# Patient Record
Sex: Female | Born: 1940
Health system: Southern US, Community
[De-identification: ages and names within clinical notes are randomized; demographics above are authoritative.]

## PROBLEM LIST (undated history)

## (undated) DIAGNOSIS — C50919 Malignant neoplasm of unspecified site of unspecified female breast: Secondary | ICD-10-CM

## (undated) DIAGNOSIS — K5790 Diverticulosis of intestine, part unspecified, without perforation or abscess without bleeding: Secondary | ICD-10-CM

## (undated) DIAGNOSIS — K635 Polyp of colon: Secondary | ICD-10-CM

## (undated) DIAGNOSIS — M1990 Unspecified osteoarthritis, unspecified site: Secondary | ICD-10-CM

## (undated) DIAGNOSIS — M810 Age-related osteoporosis without current pathological fracture: Secondary | ICD-10-CM

## (undated) DIAGNOSIS — K859 Acute pancreatitis without necrosis or infection, unspecified: Secondary | ICD-10-CM

## (undated) HISTORY — DX: Age-related osteoporosis without current pathological fracture: M81.0

## (undated) HISTORY — PX: IR SCLEROTHERAPY OF A FLUID COLLECTION: IMG6090

## (undated) HISTORY — DX: Polyp of colon: K63.5

## (undated) HISTORY — PX: BREAST BIOPSY: SHX20

## (undated) HISTORY — DX: Malignant neoplasm of unspecified site of unspecified female breast: C50.919

## (undated) HISTORY — DX: Diverticulosis of intestine, part unspecified, without perforation or abscess without bleeding: K57.90

## (undated) HISTORY — PX: OTHER SURGICAL HISTORY: SHX169

## (undated) HISTORY — DX: Acute pancreatitis without necrosis or infection, unspecified: K85.90

## (undated) HISTORY — PX: MASTECTOMY: SHX3

---

## 1968-08-04 HISTORY — PX: VARICOSE VEIN SURGERY: SHX832

## 1996-08-04 HISTORY — PX: OTHER SURGICAL HISTORY: SHX169

## 2002-08-04 DIAGNOSIS — C50919 Malignant neoplasm of unspecified site of unspecified female breast: Secondary | ICD-10-CM

## 2002-08-04 HISTORY — PX: MODIFIED RADICAL MASTECTOMY: SHX5703

## 2002-08-04 HISTORY — DX: Malignant neoplasm of unspecified site of unspecified female breast: C50.919

## 2003-08-05 HISTORY — PX: CYST EXCISION: SHX5701

## 2016-08-05 DIAGNOSIS — Z0000 Encounter for general adult medical examination without abnormal findings: Secondary | ICD-10-CM | POA: Diagnosis not present

## 2016-08-05 DIAGNOSIS — Z6820 Body mass index (BMI) 20.0-20.9, adult: Secondary | ICD-10-CM | POA: Diagnosis not present

## 2016-08-14 DIAGNOSIS — Z01411 Encounter for gynecological examination (general) (routine) with abnormal findings: Secondary | ICD-10-CM | POA: Diagnosis not present

## 2016-08-14 DIAGNOSIS — Z1239 Encounter for other screening for malignant neoplasm of breast: Secondary | ICD-10-CM | POA: Diagnosis not present

## 2016-08-14 DIAGNOSIS — Z1211 Encounter for screening for malignant neoplasm of colon: Secondary | ICD-10-CM | POA: Diagnosis not present

## 2016-08-14 DIAGNOSIS — Z1231 Encounter for screening mammogram for malignant neoplasm of breast: Secondary | ICD-10-CM | POA: Diagnosis not present

## 2016-08-14 DIAGNOSIS — E559 Vitamin D deficiency, unspecified: Secondary | ICD-10-CM | POA: Diagnosis not present

## 2016-08-14 DIAGNOSIS — N951 Menopausal and female climacteric states: Secondary | ICD-10-CM | POA: Diagnosis not present

## 2016-08-14 DIAGNOSIS — M810 Age-related osteoporosis without current pathological fracture: Secondary | ICD-10-CM | POA: Diagnosis not present

## 2016-08-14 DIAGNOSIS — Z124 Encounter for screening for malignant neoplasm of cervix: Secondary | ICD-10-CM | POA: Diagnosis not present

## 2016-08-14 DIAGNOSIS — L900 Lichen sclerosus et atrophicus: Secondary | ICD-10-CM | POA: Diagnosis not present

## 2016-08-14 DIAGNOSIS — C50911 Malignant neoplasm of unspecified site of right female breast: Secondary | ICD-10-CM | POA: Diagnosis not present

## 2016-08-14 DIAGNOSIS — L659 Nonscarring hair loss, unspecified: Secondary | ICD-10-CM | POA: Diagnosis not present

## 2016-08-14 DIAGNOSIS — Z800 Family history of malignant neoplasm of digestive organs: Secondary | ICD-10-CM | POA: Diagnosis not present

## 2016-09-03 DIAGNOSIS — Z853 Personal history of malignant neoplasm of breast: Secondary | ICD-10-CM | POA: Diagnosis not present

## 2016-09-25 DIAGNOSIS — N951 Menopausal and female climacteric states: Secondary | ICD-10-CM | POA: Diagnosis not present

## 2016-09-25 DIAGNOSIS — E559 Vitamin D deficiency, unspecified: Secondary | ICD-10-CM | POA: Diagnosis not present

## 2016-09-25 DIAGNOSIS — M810 Age-related osteoporosis without current pathological fracture: Secondary | ICD-10-CM | POA: Diagnosis not present

## 2016-10-14 DIAGNOSIS — F419 Anxiety disorder, unspecified: Secondary | ICD-10-CM | POA: Diagnosis not present

## 2016-10-14 DIAGNOSIS — R634 Abnormal weight loss: Secondary | ICD-10-CM | POA: Diagnosis not present

## 2016-10-14 DIAGNOSIS — G4700 Insomnia, unspecified: Secondary | ICD-10-CM | POA: Diagnosis not present

## 2016-10-14 DIAGNOSIS — M810 Age-related osteoporosis without current pathological fracture: Secondary | ICD-10-CM | POA: Diagnosis not present

## 2016-10-16 DIAGNOSIS — H2513 Age-related nuclear cataract, bilateral: Secondary | ICD-10-CM | POA: Diagnosis not present

## 2016-11-11 DIAGNOSIS — H25813 Combined forms of age-related cataract, bilateral: Secondary | ICD-10-CM | POA: Diagnosis not present

## 2016-11-11 DIAGNOSIS — H02834 Dermatochalasis of left upper eyelid: Secondary | ICD-10-CM | POA: Diagnosis not present

## 2016-11-11 DIAGNOSIS — H02831 Dermatochalasis of right upper eyelid: Secondary | ICD-10-CM | POA: Diagnosis not present

## 2016-11-20 DIAGNOSIS — H25813 Combined forms of age-related cataract, bilateral: Secondary | ICD-10-CM | POA: Diagnosis not present

## 2016-11-25 DIAGNOSIS — I10 Essential (primary) hypertension: Secondary | ICD-10-CM | POA: Diagnosis not present

## 2016-11-25 DIAGNOSIS — H269 Unspecified cataract: Secondary | ICD-10-CM | POA: Diagnosis not present

## 2016-11-25 DIAGNOSIS — Z6821 Body mass index (BMI) 21.0-21.9, adult: Secondary | ICD-10-CM | POA: Diagnosis not present

## 2016-11-25 DIAGNOSIS — M810 Age-related osteoporosis without current pathological fracture: Secondary | ICD-10-CM | POA: Diagnosis not present

## 2016-12-02 HISTORY — PX: CATARACT EXTRACTION: SUR2

## 2016-12-08 DIAGNOSIS — H25812 Combined forms of age-related cataract, left eye: Secondary | ICD-10-CM | POA: Diagnosis not present

## 2016-12-08 DIAGNOSIS — H2512 Age-related nuclear cataract, left eye: Secondary | ICD-10-CM | POA: Diagnosis not present

## 2016-12-09 DIAGNOSIS — Z961 Presence of intraocular lens: Secondary | ICD-10-CM | POA: Diagnosis not present

## 2016-12-09 DIAGNOSIS — Z9842 Cataract extraction status, left eye: Secondary | ICD-10-CM | POA: Diagnosis not present

## 2016-12-17 DIAGNOSIS — H25811 Combined forms of age-related cataract, right eye: Secondary | ICD-10-CM | POA: Diagnosis not present

## 2016-12-22 DIAGNOSIS — H25811 Combined forms of age-related cataract, right eye: Secondary | ICD-10-CM | POA: Diagnosis not present

## 2017-01-22 ENCOUNTER — Ambulatory Visit: Payer: Self-pay | Admitting: Family Medicine

## 2017-01-23 ENCOUNTER — Ambulatory Visit: Payer: Self-pay | Admitting: Family Medicine

## 2017-04-09 DIAGNOSIS — Z23 Encounter for immunization: Secondary | ICD-10-CM | POA: Diagnosis not present

## 2017-04-09 DIAGNOSIS — Z681 Body mass index (BMI) 19 or less, adult: Secondary | ICD-10-CM | POA: Diagnosis not present

## 2017-04-09 DIAGNOSIS — C50919 Malignant neoplasm of unspecified site of unspecified female breast: Secondary | ICD-10-CM | POA: Diagnosis not present

## 2017-04-09 DIAGNOSIS — I8390 Asymptomatic varicose veins of unspecified lower extremity: Secondary | ICD-10-CM | POA: Diagnosis not present

## 2017-04-09 DIAGNOSIS — D126 Benign neoplasm of colon, unspecified: Secondary | ICD-10-CM | POA: Diagnosis not present

## 2017-04-09 DIAGNOSIS — M810 Age-related osteoporosis without current pathological fracture: Secondary | ICD-10-CM | POA: Diagnosis not present

## 2017-04-09 DIAGNOSIS — Z1389 Encounter for screening for other disorder: Secondary | ICD-10-CM | POA: Diagnosis not present

## 2017-04-10 DIAGNOSIS — Z1389 Encounter for screening for other disorder: Secondary | ICD-10-CM | POA: Diagnosis not present

## 2017-04-10 DIAGNOSIS — C50919 Malignant neoplasm of unspecified site of unspecified female breast: Secondary | ICD-10-CM | POA: Diagnosis not present

## 2017-04-10 DIAGNOSIS — I8390 Asymptomatic varicose veins of unspecified lower extremity: Secondary | ICD-10-CM | POA: Diagnosis not present

## 2017-04-10 DIAGNOSIS — M810 Age-related osteoporosis without current pathological fracture: Secondary | ICD-10-CM | POA: Diagnosis not present

## 2017-04-10 DIAGNOSIS — Z23 Encounter for immunization: Secondary | ICD-10-CM | POA: Diagnosis not present

## 2017-04-10 DIAGNOSIS — Z681 Body mass index (BMI) 19 or less, adult: Secondary | ICD-10-CM | POA: Diagnosis not present

## 2017-04-10 DIAGNOSIS — D126 Benign neoplasm of colon, unspecified: Secondary | ICD-10-CM | POA: Diagnosis not present

## 2017-05-06 ENCOUNTER — Encounter: Payer: Self-pay | Admitting: Internal Medicine

## 2017-06-30 ENCOUNTER — Ambulatory Visit (INDEPENDENT_AMBULATORY_CARE_PROVIDER_SITE_OTHER): Payer: Medicare Other | Admitting: Internal Medicine

## 2017-06-30 ENCOUNTER — Encounter: Payer: Self-pay | Admitting: Internal Medicine

## 2017-06-30 ENCOUNTER — Encounter (INDEPENDENT_AMBULATORY_CARE_PROVIDER_SITE_OTHER): Payer: Self-pay

## 2017-06-30 VITALS — BP 150/70 | HR 96 | Ht 66.0 in | Wt 133.1 lb

## 2017-06-30 DIAGNOSIS — Z86010 Personal history of colonic polyps: Secondary | ICD-10-CM

## 2017-06-30 NOTE — Progress Notes (Addendum)
HISTORY OF PRESENT ILLNESS:  Lindsay Leach is a 76 y.o. female, retired LPN who sent today by her primary care provider Dr. Forde Dandy with a chief complaint of needing colon polyp surveillance colonoscopy. She relocated to [place] from [place] over the past year. She is accompanied today by her husband. She tells me that she has undergone at least 3 previous colonoscopies. No available information for review. She tells me that the last examination was performed July 2015. No family history of colon cancer in first-degree relatives. Maternal uncle at age 1. She does tell me that she has had diverticulosis. She is not certain what type of colon polyps she has had or how many. Her GI review of systems is entirely negative.  REVIEW OF SYSTEMS:  All non-GI ROS negative unless otherwise stated in the history of present illness .  Past Medical History:  Diagnosis Date  . Breast cancer ([place]) 2004   right  . Colon polyps   . Diverticulosis   . Pancreatitis     Past Surgical History:  Procedure Laterality Date  . CYST EXCISION  2005   Val[facility]cular  . IR SCLEROTHERAPY OF A FLUID COLLECTION  200, 2006  . MODIFIED RADICAL MASTECTOMY Right 2004  . Phlebectomies  1998  . VARICOSE VEIN SURGERY Bilateral 1970    Social History Lindsay Leach  reports that  has never smoked. she has never used smokeless tobacco. She reports that she does not drink alcohol or use drugs.  family history includes Colon cancer in her maternal uncle; Diabetes in her maternal grandfather; Lung cancer in her father.  Allergies  Allergen Reactions  . Advil [Ibuprofen] Rash  . Sulfa Antibiotics Nausea Only       PHYSICAL EXAMINATION: Vital signs: BP (!) 150/70 (BP Location: Left Arm, Patient Position: Sitting, Cuff Size: Normal)   Pulse 96   Ht 5\' 6"  (1.676 m) Comment: height measured without shoes  Wt 133 lb 2 oz (60.4 kg)   BMI 21.49 kg/m   Constitutional: generally well-appearing, no acute distress Psychiatric:  alert and oriented x3, cooperative Eyes: extraocular movements intact, anicteric, conjunctiva pink Mouth: oral pharynx moist, no lesions Neck: supple no lymphadenopathy Cardiovascular: heart regular rate and rhythm, no murmur Lungs: clear to auscultation bilaterally Abdomen: soft, nontender, nondistended, no obvious ascites, no peritoneal signs, normal bowel sounds, no organomegaly Rectal: Omitted Extremities: no clubbing, cyanosis, or lower extremity edema bilaterally Skin: no lesions on visible extremities Neuro: No focal deficits. Cranial nerves intact  ASSESSMENT:  #50. 76 year old with history of colon polyps. Last colonoscopy 3 years ago. No available information for review. I explained to her current colon cancer surveillance guidelines. We discussed these with and without respect age. At this point I feel it is important to obtain her prior reports and pathology to stratify risk and make the most appropriate recommendation. She understands, agrees, and is appreciative   PLAN:  #1. My medical assistant will obtain relevant outside records from [place]. Thereafter I will review these records and my assistant will get back to the patient with a recommendation. I have asked the patient to contact the office if she has not heard from [place] within a reasonable amount of time   A copy of this consultation note has been sent to Dr. Forde Dandy  ADDENDUM 08/14/2017 Pathology report from [place] [facility] received. Date of pathology 02/13/2014. Found to have subcentimeter tubular adenoma and hyperplastic polyp. No colonoscopy report. Based on this information routine follow-up would be recommended in 5 years  assuming that colonoscopy was complete with adequate preparation. I will have my nurse sure this information with the patient and see if she might obtain the colonoscopy report just to be certain.

## 2017-06-30 NOTE — Patient Instructions (Signed)
We will call you after Dr. Henrene Pastor has reviewed your records

## 2017-07-23 ENCOUNTER — Telehealth: Payer: Self-pay | Admitting: Internal Medicine

## 2017-07-24 NOTE — Telephone Encounter (Signed)
Spoke with patient and told her that I had left a message with the person who sent the records that they had send the pathology but not the actual colonoscopy.  I have not heard back so I will follow up today on getting the colonoscopy report to go with the pathology.  Patient understood.

## 2017-08-17 ENCOUNTER — Encounter: Payer: Self-pay | Admitting: Internal Medicine

## 2017-08-17 NOTE — Telephone Encounter (Signed)
Patient is wanting to know if we have received all previous GI records. Best # [phone]

## 2017-08-20 ENCOUNTER — Telehealth: Payer: Self-pay

## 2017-08-20 NOTE — Telephone Encounter (Signed)
Left message for pt to call back.  Spoke with pt and she is aware. Pt will try to get colon report. She was scheduled for a previsit and colon in February so these appts have been cancelled. Recall entered in epic for 02/2019.

## 2017-08-20 NOTE — Telephone Encounter (Signed)
-----   Message from Irene Shipper, MD sent at 08/14/2017 11:52 AM EST ----- Regarding: Outside pathology report received Please contact the patient and let her know that I received her outside pathology report. There was no accompanying colonoscopy report. If she can get a colonoscopy report that would be helpful. If not, based on the available information and assuming that her colonoscopy was complete and adequate preparation, she would be due 5 years from her last examination or July 2020. She will be 78 at that time. We are happy to put in a recall if she is interested in surveillance at that time and is medically fit. Please convert this to a phone note so it may be referred to the future. Thank you Dr. Henrene Pastor

## 2017-09-30 ENCOUNTER — Encounter: Payer: Medicare Other | Admitting: Internal Medicine

## 2017-10-07 DIAGNOSIS — R82998 Other abnormal findings in urine: Secondary | ICD-10-CM | POA: Diagnosis not present

## 2019-01-31 ENCOUNTER — Encounter: Payer: Self-pay | Admitting: Internal Medicine

## 2019-04-15 ENCOUNTER — Emergency Department (HOSPITAL_COMMUNITY)
Admission: EM | Admit: 2019-04-15 | Discharge: 2019-04-15 | Disposition: A | Discharge status: Home / Self Care | Payer: Medicare Other | Attending: Emergency Medicine | Admitting: Emergency Medicine

## 2019-04-15 ENCOUNTER — Emergency Department (HOSPITAL_COMMUNITY): Payer: Medicare Other

## 2019-04-15 ENCOUNTER — Other Ambulatory Visit: Payer: Self-pay

## 2019-04-15 DIAGNOSIS — R000 Tachycardia, unspecified: Secondary | ICD-10-CM

## 2019-04-15 DIAGNOSIS — R002 Palpitations: Secondary | ICD-10-CM | POA: Diagnosis not present

## 2019-04-15 DIAGNOSIS — Z79899 Other long term (current) drug therapy: Secondary | ICD-10-CM | POA: Insufficient documentation

## 2019-04-15 DIAGNOSIS — Z20828 Contact with and (suspected) exposure to other viral communicable diseases: Secondary | ICD-10-CM | POA: Diagnosis not present

## 2019-04-15 LAB — URINALYSIS, ROUTINE W REFLEX MICROSCOPIC
Bacteria, UA: NONE SEEN
Bilirubin Urine: NEGATIVE
Glucose, UA: NEGATIVE mg/dL
Ketones, ur: NEGATIVE mg/dL
Leukocytes,Ua: NEGATIVE
Nitrite: NEGATIVE
Protein, ur: NEGATIVE mg/dL
Specific Gravity, Urine: 1.005 (ref 1.005–1.030)
pH: 7 (ref 5.0–8.0)

## 2019-04-15 LAB — CBC WITH DIFFERENTIAL/PLATELET
Abs Immature Granulocytes: 0.02 10*3/uL (ref 0.00–0.07)
Basophils Absolute: 0 10*3/uL (ref 0.0–0.1)
Basophils Relative: 0 %
Eosinophils Absolute: 0 10*3/uL (ref 0.0–0.5)
Eosinophils Relative: 0 %
HCT: 42.1 % (ref 36.0–46.0)
Hemoglobin: 13.7 g/dL (ref 12.0–15.0)
Immature Granulocytes: 0 %
Lymphocytes Relative: 19 %
Lymphs Abs: 1.2 10*3/uL (ref 0.7–4.0)
MCH: 31 pg (ref 26.0–34.0)
MCHC: 32.5 g/dL (ref 30.0–36.0)
MCV: 95.2 fL (ref 80.0–100.0)
Monocytes Absolute: 0.4 10*3/uL (ref 0.1–1.0)
Monocytes Relative: 7 %
Neutro Abs: 4.7 10*3/uL (ref 1.7–7.7)
Neutrophils Relative %: 74 %
Platelets: 218 10*3/uL (ref 150–400)
RBC: 4.42 MIL/uL (ref 3.87–5.11)
RDW: 12.6 % (ref 11.5–15.5)
WBC: 6.4 10*3/uL (ref 4.0–10.5)
nRBC: 0 % (ref 0.0–0.2)

## 2019-04-15 LAB — COMPREHENSIVE METABOLIC PANEL
ALT: 20 U/L (ref 0–44)
AST: 22 U/L (ref 15–41)
Albumin: 4.4 g/dL (ref 3.5–5.0)
Alkaline Phosphatase: 78 U/L (ref 38–126)
Anion gap: 11 (ref 5–15)
BUN: 9 mg/dL (ref 8–23)
CO2: 26 mmol/L (ref 22–32)
Calcium: 9.6 mg/dL (ref 8.9–10.3)
Chloride: 103 mmol/L (ref 98–111)
Creatinine, Ser: 0.65 mg/dL (ref 0.44–1.00)
GFR calc Af Amer: >60 mL/min (ref >60)
GFR calc non Af Amer: >60 mL/min (ref >60)
Glucose, Bld: 131 mg/dL — ABNORMAL HIGH (ref 70–99)
Potassium: 3.8 mmol/L (ref 3.5–5.1)
Sodium: 140 mmol/L (ref 135–145)
Total Bilirubin: 0.6 mg/dL (ref 0.3–1.2)
Total Protein: 7.7 g/dL (ref 6.5–8.1)

## 2019-04-15 LAB — LACTIC ACID, PLASMA: Lactic Acid, Venous: 1.7 mmol/L (ref 0.5–1.9)

## 2019-04-15 LAB — SARS CORONAVIRUS 2 BY RT PCR (HOSPITAL ORDER, PERFORMED IN ~~LOC~~ HOSPITAL LAB): SARS Coronavirus 2: NEGATIVE

## 2019-04-15 LAB — MAGNESIUM: Magnesium: 2.1 mg/dL (ref 1.7–2.4)

## 2019-04-15 MED ORDER — ASPIRIN EC 325 MG PO TBEC: 325.0000 mg | DELAYED_RELEASE_TABLET | Freq: Every day | ORAL | 0 refills | Status: DC

## 2019-04-15 MED ORDER — SODIUM CHLORIDE 0.9 % IV BOLUS
1000.0000 mL | Freq: Once | INTRAVENOUS | Status: AC
  Administered 2019-04-15: 1000 mL via INTRAVENOUS

## 2019-04-15 NOTE — ED Notes (Signed)
Patient verbalized understanding of discharge instructions and denies any further needs or questions at this time. VS stable. Patient ambulatory with steady gait. Assisted to ED entrance where her daughter will pick her up.

## 2019-04-15 NOTE — ED Triage Notes (Signed)
Patient in via [place] from [facility] for tachycardia - ECG with EMS showed SVT with HR 160-180 - decreased to 100 after receiving 5mg  metoprolol IV en route. Patient states she went to her doctor because she's felt lightheaded with position changes over the last 4 days. Also endorses fever 100.20F at home last night and this morning, though she denies any other symptoms. No chest pain, shortness of breath, cough. A&O x 4, denies dizziness at this time. Resp e/u, skin w/d.

## 2019-04-15 NOTE — ED Provider Notes (Signed)
Blood pressure (!) 142/89, pulse (!) 112, temperature 98.6 F (37 C), temperature source Oral, resp. rate 17, SpO2 99 %.  Assuming care from Dr. Regenia Skeeter.  In short, Lindsay Leach is a 78 y.o. female with a chief complaint of Tachycardia .  Refer to the original H&P for additional details.  The current plan of care is to f/u on COVID testing and UA.  03:50 PM  COVID testing and UA are unremarkable.  Patient is eating and drinking without difficulty.  She is ambulatory here in the department with no symptoms.  She is in a sinus rhythm without tachycardia.  My review of the EMS strip shows SVT with occasional irregularity.  My suspicion that this represents atrial fibrillation is very low.  Given the somewhat irregular appearance at times I will start a full dose aspirin, at the patient has no contraindications to this, and will refer her to cardiology.  Ambulatory referral was placed.  Discussed ED return precautions.   EKG Interpretation  Date/Time:  Friday April 15 2019 13:43:13 EDT Ventricular Rate:  120 PR Interval:    QRS Duration: 82 QT Interval:  302 QTC Calculation: 427 R Axis:   81 Text Interpretation:  Sinus tachycardia Borderline right axis deviation Left ventricular hypertrophy No old tracing to compare Confirmed by Sherwood Gambler [phone]) on 04/15/2019 1:49:40 PM          Lindsay Leach, Lindsay Olds, MD 04/15/19 1556

## 2019-04-15 NOTE — ED Notes (Signed)
Patient ambulatory to restroom and back. Denies lightheadedness with position changes.

## 2019-04-15 NOTE — ED Notes (Signed)
MD states okay for patient to eat/drink - provided patient with [place] sandwich and ice water.

## 2019-04-15 NOTE — Discharge Instructions (Signed)
You were seen in the emergency department today with high heart rate.  I would like for you to start a full dose aspirin regimen and follow-up with the cardiologist.  I have placed a referral and they should be calling you to schedule an appointment.  If you have not heard from them by late Monday/early Tuesday I would like for you to call to confirm your appointment.  Return to the emergency department with any new or suddenly worsening symptoms.

## 2019-04-15 NOTE — ED Provider Notes (Signed)
[facility] EMERGENCY DEPARTMENT Provider Note   CSN: [id number] Arrival date & time: 04/15/19  1330     History   Chief Complaint Chief Complaint  Patient presents with  . Tachycardia    HPI Lindsay Leach is a 78 y.o. female.     HPI  78 year old female presents with dizziness and tachycardia.  The patient has felt lightheaded like she might pass out for about 4 days.  Comes and goes.  No palpitations though occasionally she has heard her heart beating in her head.  There is no headache, chest pain, shortness of breath, cough, vomiting, diarrhea or abdominal pain.  No urinary symptoms.  Yesterday and today had temperatures of 100.4.  Her blood pressure was also quite elevated at [id number] systolic but she states she does not have hypertension.  EMS noted her to be very tachycardic and what they described as possible SVT though with fluctuating heart rates up to 160.  Gave 5 mg IV metoprolol which seemed to help.  Patient currently feels okay. No specific known COVID contacts.  Past Medical History:  Diagnosis Date  . Breast cancer ([place]) 2004   right  . Colon polyps   . Diverticulosis   . Pancreatitis     There are no active problems to display for this patient.   Past Surgical History:  Procedure Laterality Date  . CYST EXCISION  2005   Vallecular  . IR SCLEROTHERAPY OF A FLUID COLLECTION  200, 2006  . MODIFIED RADICAL MASTECTOMY Right 2004  . Phlebectomies  1998  . VARICOSE VEIN SURGERY Bilateral 1970     OB History   No obstetric history on file.      Home Medications    Prior to Admission medications   Medication Sig Start Date End Date Taking? Authorizing Provider  BIOTIN 5000 PO Take 1 capsule by mouth daily.   Yes [provider]  cholecalciferol (VITAMIN D) 1000 units tablet Take 1,000 Units by mouth 2 (two) times daily.   Yes [provider]  ibandronate (BONIVA) 150 MG tablet Take 150 mg by mouth every 30 (thirty) days.  03/15/19  Yes [provider]  magnesium gluconate (MAGONATE) 500 MG tablet Take 500 mg by mouth daily.   Yes [provider]  Multiple Vitamins-Minerals (CENTRUM SILVER PO) Take 1 tablet by mouth daily.   Yes [provider]    Family History Family History  Problem Relation Age of Onset  . Lung cancer Father   . Diabetes Maternal Grandfather   . Colon cancer Maternal Uncle     Social History Social History   Tobacco Use  . Smoking status: Never Smoker  . Smokeless tobacco: Never Used  Substance Use Topics  . Alcohol use: No    Frequency: Never  . Drug use: No     Allergies   Advil [ibuprofen] and Sulfa antibiotics   Review of Systems Review of Systems  Constitutional: Positive for fever.  Respiratory: Negative for cough and shortness of breath.   Cardiovascular: Negative for chest pain.  Gastrointestinal: Negative for abdominal pain and vomiting.  Genitourinary: Negative for dysuria.  Skin: Negative for rash.  Neurological: Positive for light-headedness.  All other systems reviewed and are negative.    Physical Exam Updated Vital Signs BP (!) 157/93   Pulse 96   Temp 98.6 F (37 C) (Oral)   Resp 16   SpO2 99%   Physical Exam Vitals signs and nursing note reviewed.  Constitutional:  Appearance: She is well-developed.  HENT:     Head: Normocephalic and atraumatic.     Right Ear: External ear normal.     Left Ear: External ear normal.     Nose: Nose normal.  Eyes:     General:        Right eye: No discharge.        Left eye: No discharge.  Cardiovascular:     Rate and Rhythm: Regular rhythm. Tachycardia present.     Heart sounds: Normal heart sounds.  Pulmonary:     Effort: Pulmonary effort is normal.     Breath sounds: Normal breath sounds.  Chest:     Comments: Previous right mastectomy Abdominal:     General: There is no distension.     Palpations: Abdomen is soft.     Tenderness: There is no abdominal  tenderness.  Skin:    General: Skin is warm and dry.     Findings: No erythema.  Neurological:     Mental Status: She is alert.  Psychiatric:        Mood and Affect: Mood is not anxious.      ED Treatments / Results  Labs (all labs ordered are listed, but only abnormal results are displayed) Labs Reviewed  COMPREHENSIVE METABOLIC PANEL - Abnormal; Notable for the following components:      Result Value   Glucose, Bld 131 (*)    All other components within normal limits  URINALYSIS, ROUTINE W REFLEX MICROSCOPIC - Abnormal; Notable for the following components:   Color, Urine STRAW (*)    Hgb urine dipstick SMALL (*)    All other components within normal limits  SARS CORONAVIRUS 2 (HOSPITAL ORDER, [place] [facility])  CULTURE, BLOOD (ROUTINE X 2)  CULTURE, BLOOD (ROUTINE X 2)  URINE CULTURE  LACTIC ACID, PLASMA  CBC WITH DIFFERENTIAL/PLATELET  MAGNESIUM  LACTIC ACID, PLASMA    EKG EKG Interpretation  Date/Time:  Friday April 15 2019 13:43:13 EDT Ventricular Rate:  120 PR Interval:    QRS Duration: 82 QT Interval:  302 QTC Calculation: 427 R Axis:   81 Text Interpretation:  Sinus tachycardia Borderline right axis deviation Left ventricular hypertrophy No old tracing to compare Confirmed by Sherwood Gambler [phone]) on 04/15/2019 1:49:40 PM   Radiology No results found.  Procedures Procedures (including critical care time)  Medications Ordered in ED Medications  sodium chloride 0.9 % bolus 1,000 mL (0 mLs Intravenous Stopped 04/15/19 1511)     Initial Impression / Assessment and Plan / ED Course  I have reviewed the triage vital signs and the nursing notes.  Pertinent labs & imaging results that were available during my care of the patient were reviewed by me and considered in my medical decision making (see chart for details).        Patient is asymptomatic here.  She reports fever at home though is afebrile here.  Will send  coronavirus testing.  Otherwise screening lab work so far is okay and she has been given IV fluids.  Sinus tachycardia on original presentation though this is improving with fluids.  Her monitoring strip from EMS could be rapid A. fib or could be SVT though it is not clear on the 3-lead given.  Further labs and treatment pending, if everything came back okay I think would be reasonable to discharge her home assuming she does not go back into an arrhythmia and will need cards follow-up.  Care to Dr. Laverta Baltimore  Final Clinical Impressions(s) / ED Diagnoses   Final diagnoses:  None    ED Discharge Orders    None       Sherwood Gambler, MD 04/15/19 [phone]

## 2019-04-16 LAB — URINE CULTURE: Culture: <10000 — AB

## 2019-04-18 DIAGNOSIS — Z720 Tobacco use: Secondary | ICD-10-CM | POA: Insufficient documentation

## 2019-04-18 DIAGNOSIS — R002 Palpitations: Secondary | ICD-10-CM | POA: Insufficient documentation

## 2019-04-18 NOTE — Progress Notes (Signed)
Cardiology Office Note   Date:  04/19/2019   ID:  Lindsay Leach, DOB Dec 25, 1940, MRN [id number]  PCP:  Reynold Bowen, MD  Cardiologist:   No primary care provider on file.   No chief complaint on file.     History of Present Illness: Lindsay Leach is a 78 y.o. female who is referred by Reynold Bowen, MD for evaluation of palpitations and tachycardia.    She was in the ED for this a few days ago.  I reviewed these records for this visit.  She called EMS and was thought to have SVT.  she was treated with IV metoprolol.  In the ED she had a HR of 120.  The EKG appeared to be sinus tach.      She went to her PCP because of increased HR and lightheadedness the other day.  Her heart rate was elevated but it typically is in the 90s.  However, when she went to Dr. Forde Dandy her blood pressure was 186/106.  Her heart rate was 160s.  When she has the emergency room was elevated as prescribed.  Blood work was unremarkable.  She had some recent blood work sent her primary care office but I do not see a TSH.  This is all been unremarkable.  She wondered if she could have had a panic attack because she has been watching the news.  She otherwise feels pretty well.  She does her activities of daily living and household chores.  She does some balance exercises and walking.  She says she is always been slightly nervous but she otherwise feels well.  She is not having any chest discomfort, neck or arm discomfort.  She is not been having any shortness of breath, PND or orthopnea.  She is not describing any further lightheadedness, presyncope or syncope.  She does not report orthostatic symptoms.   Past Medical History:  Diagnosis Date  . Breast cancer ([place]) 2004   right  . Colon polyps   . Diverticulosis   . Osteoporosis   . Pancreatitis     Past Surgical History:  Procedure Laterality Date  . CYST EXCISION  2005   Vallecular  . IR SCLEROTHERAPY OF A FLUID COLLECTION  200, 2006  . MODIFIED RADICAL  MASTECTOMY Right 2004  . Phlebectomies  1998  . VARICOSE VEIN SURGERY Bilateral 1970     Current Outpatient Medications  Medication Sig Dispense Refill  . BIOTIN 5000 PO Take 1 capsule by mouth daily.    . cholecalciferol (VITAMIN D) 1000 units tablet Take 1,000 Units by mouth 2 (two) times daily.    Marland Kitchen ibandronate (BONIVA) 150 MG tablet Take 150 mg by mouth every 30 (thirty) days.    . magnesium gluconate (MAGONATE) 500 MG tablet Take 500 mg by mouth daily.    . Multiple Vitamins-Minerals (CENTRUM SILVER PO) Take 1 tablet by mouth daily.     No current facility-administered medications for this visit.     Allergies:   Advil [ibuprofen] and Sulfa antibiotics    Social History:  The patient  reports that she has never smoked. She has never used smokeless tobacco. She reports that she does not drink alcohol or use drugs.   Family History:  The patient's family history includes Colon cancer in her maternal uncle; Diabetes in her maternal grandfather; Lung cancer in her father.    ROS:  Please see the history of present illness.   Otherwise, review of systems are positive for none.  All other systems are reviewed and negative.    PHYSICAL EXAM: VS:  BP (!) 179/89   Pulse (!) 123   Temp (!) 97.3 F (36.3 C)   Ht 5\' 6"  (1.676 m)   Wt 143 lb 12.8 oz (65.2 kg)   SpO2 97%   BMI 23.21 kg/m  , BMI Body mass index is 23.21 kg/m. GENERAL:  Well appearing HEENT:  Pupils equal round and reactive, fundi not visualized, oral mucosa unremarkable NECK:  No jugular venous distention, waveform within normal limits, carotid upstroke brisk and symmetric, no bruits, no thyromegaly LYMPHATICS:  No cervical, inguinal adenopathy LUNGS:  Clear to auscultation bilaterally BACK:  No CVA tenderness CHEST:  Unremarkable HEART:  PMI not displaced or sustained,S1 and S2 within normal limits, no S3, no S4, no clicks, no rubs, no murmurs ABD:  Flat, positive bowel sounds normal in frequency in pitch, no  bruits, no rebound, no guarding, no midline pulsatile mass, no hepatomegaly, no splenomegaly EXT:  2 plus pulses throughout, no edema, no cyanosis no clubbing SKIN:  No rashes no nodules NEURO:  Cranial nerves II through XII grossly intact, motor grossly intact throughout PSYCH:  Cognitively intact, oriented to person place and time    EKG:  EKG is ordered today. The ekg ordered today demonstrates sinus tachycardia, rate 105, axis within normal limits, intervals within normal limits, no acute ST-T wave changes.   Recent Labs: 04/15/2019: ALT 20; BUN 9; Creatinine, Ser 0.65; Hemoglobin 13.7; Magnesium 2.1; Platelets 218; Potassium 3.8; Sodium 140    Lipid Panel No results found for: CHOL, TRIG, HDL, CHOLHDL, VLDL, LDLCALC, LDLDIRECT    Wt Readings from Last 3 Encounters:  04/19/19 143 lb 12.8 oz (65.2 kg)  06/30/17 133 lb 2 oz (60.4 kg)      Other studies Reviewed: Additional studies/ records that were reviewed today include: ED records. Review of the above records demonstrates:  Please see elsewhere in the note.     ASSESSMENT AND PLAN:  Tachycardia/palpitaitons:    This could be panic anxiety but I like to start with a TSH and a Holter monitor to make sure she has normal nighttime variation.  Other considerations would be pheochromocytoma although she is not having symptoms consistent with this.  I will keep this in mind for further work-up.  HTN: I reviewed her blood pressure diary and instructed her on getting more data points.  Her blood pressures been creeping down typically in the 140s over 90s now.  She might benefit from a beta-blocker pending the work-up above.   Current medicines are reviewed at length with the patient today.  The patient does not have concerns regarding medicines.  The following changes have been made:  no change  Labs/ tests ordered today include:   Orders Placed This Encounter  Procedures  . TSH  . LONG TERM MONITOR (3-14 DAYS)  . EKG  12-Lead     Disposition:   FU with me in one month after the Holter and blood work.   Signed, Minus Breeding, MD  04/19/2019 3:12 PM    [facility]

## 2019-04-19 ENCOUNTER — Other Ambulatory Visit: Payer: Self-pay

## 2019-04-19 ENCOUNTER — Ambulatory Visit (INDEPENDENT_AMBULATORY_CARE_PROVIDER_SITE_OTHER): Payer: Medicare Other | Admitting: Cardiology

## 2019-04-19 ENCOUNTER — Encounter: Payer: Self-pay | Admitting: Cardiology

## 2019-04-19 VITALS — BP 179/89 | HR 123 | Temp 97.3°F | Ht 66.0 in | Wt 143.8 lb

## 2019-04-19 DIAGNOSIS — R002 Palpitations: Secondary | ICD-10-CM

## 2019-04-19 DIAGNOSIS — I10 Essential (primary) hypertension: Secondary | ICD-10-CM | POA: Diagnosis not present

## 2019-04-19 NOTE — Patient Instructions (Addendum)
Medication Instructions:  STOP ASPIRIN   If you need a refill on your cardiac medications before your next appointment, please call your pharmacy.   Lab work: TSH TODAY   If you have labs (blood work) drawn today and your tests are completely normal, you will receive your results only by: Marland Kitchen MyChart Message (if you have MyChart) OR . A paper copy in the mail If you have any lab test that is abnormal or we need to change your treatment, we will call you to review the results.  Testing/Procedures: Your physician has recommended that you wear a holter monitor. Holter monitors are medical devices that record the heart's electrical activity. Doctors most often use these monitors to diagnose arrhythmias. Arrhythmias are problems with the speed or rhythm of the heartbeat. The monitor is a small, portable device. You can wear one while you do your normal daily activities. This is usually used to diagnose what is causing palpitations/syncope (passing out). 3 DAY MONITOR   Follow-Up: At [facility], you and your health needs are our priority.  As part of our continuing mission to provide you with exceptional heart care, we have created designated Provider Care Teams.  These Care Teams include your primary Cardiologist (physician) and Advanced Practice Providers (APPs -  Physician Assistants and Nurse Practitioners) who all work together to provide you with the care you need, when you need it. You will need a follow up appointment in 4 weeks.   You may see DR [facility] or one of the following Advanced Practice Providers on your designated Care Team:   Rosaria Ferries, PA-C . Jory Sims, DNP, ANP

## 2019-04-20 LAB — CULTURE, BLOOD (ROUTINE X 2)
Culture: NO GROWTH
Culture: NO GROWTH
Special Requests: ADEQUATE

## 2019-04-20 LAB — TSH: TSH: 0.914 u[IU]/mL (ref 0.450–4.500)

## 2019-04-21 ENCOUNTER — Telehealth: Payer: Self-pay | Admitting: *Deleted

## 2019-04-21 NOTE — Telephone Encounter (Signed)
3-14 day ZIO XT long term holter monitor to be mailed to the patients home.  Instructions reviewed briefly as they are included in the monitor kit. 

## 2019-04-29 ENCOUNTER — Ambulatory Visit (INDEPENDENT_AMBULATORY_CARE_PROVIDER_SITE_OTHER): Payer: Medicare Other

## 2019-04-29 DIAGNOSIS — R002 Palpitations: Secondary | ICD-10-CM | POA: Diagnosis not present

## 2019-05-16 DIAGNOSIS — I10 Essential (primary) hypertension: Secondary | ICD-10-CM | POA: Insufficient documentation

## 2019-05-16 DIAGNOSIS — R000 Tachycardia, unspecified: Secondary | ICD-10-CM | POA: Insufficient documentation

## 2019-05-16 NOTE — Progress Notes (Signed)
Cardiology Office Note   Date:  05/16/2019   ID:  Lindsay Leach, DOB 1941-04-01, MRN [id number]  PCP:  Reynold Bowen, MD  Cardiologist:   No primary care provider on file.   No chief complaint on file.     History of Present Illness: Lindsay Leach is a 78 y.o. female who is referred by Reynold Bowen, MD for evaluation of palpitations.    She was in the ED for this a few days ago.  I reviewed these records for this visit.  She called EMS and had a heart rate of 160.   I cannot find these strips.  The ED note mentions fibrillation vs SVT.  She was treated with metoprolol.       Since I last saw her she wore a monitor and I reviewed this with her today.  She had brief runs of nonsustained nonsustained supraventricular tachycardia.  There was no evidence of atrial fibrillation.  There was rare ventricular ectopy.  She did not have any real symptoms during this.  She is occasionally getting anxious.  She might feel her heart race a little bit but she did not record this on the monitor.  She tries to be active.  She is not having any chest pressure, neck or arm discomfort.  She is had no new shortness of breath, PND or orthopnea.  Has had no presyncope or syncope.  She is had no weight gain or edema.    Past Medical History:  Diagnosis Date  . Breast cancer ([place]) 2004   right  . Colon polyps   . Diverticulosis   . Osteoporosis   . Pancreatitis     Past Surgical History:  Procedure Laterality Date  . CYST EXCISION  2005   [place]  . IR SCLEROTHERAPY OF A FLUID COLLECTION  200, 2006  . MODIFIED RADICAL MASTECTOMY Right 2004  . Phlebectomies  1998  . VARICOSE VEIN SURGERY Bilateral 1970     Current Outpatient Medications  Medication Sig Dispense Refill  . BIOTIN 5000 PO Take 1 capsule by mouth daily.    . cholecalciferol (VITAMIN D) 1000 units tablet Take 1,000 Units by mouth 2 (two) times daily.    Marland Kitchen ibandronate (BONIVA) 150 MG tablet Take 150 mg by mouth every 30  (thirty) days.    . magnesium gluconate (MAGONATE) 500 MG tablet Take 500 mg by mouth daily.    . Multiple Vitamins-Minerals (CENTRUM SILVER PO) Take 1 tablet by mouth daily.     No current facility-administered medications for this visit.     Allergies:   Advil [ibuprofen] and Sulfa antibiotics    ROS:  Please see the history of present illness.   Otherwise, review of systems are positive for none.   All other systems are reviewed and negative.    PHYSICAL EXAM: VS:  There were no vitals taken for this visit. , BMI There is no height or weight on file to calculate BMI. GENERAL:  Well appearing NECK:  No jugular venous distention, waveform within normal limits, carotid upstroke brisk and symmetric, no bruits, no thyromegaly LUNGS:  Clear to auscultation bilaterally HEART:  PMI not displaced or sustained,S1 and S2 within normal limits, no S3, no S4, no clicks, no rubs, no murmurs ABD:  Flat, positive bowel sounds normal in frequency in pitch, no bruits, no rebound, no guarding, no midline pulsatile mass, no hepatomegaly, no splenomegaly EXT:  2 plus pulses throughout, no edema, no cyanosis no clubbing   EKG:  EKG is not ordered today.    Recent Labs: 04/15/2019: ALT 20; BUN 9; Creatinine, Ser 0.65; Hemoglobin 13.7; Magnesium 2.1; Platelets 218; Potassium 3.8; Sodium 140 04/19/2019: TSH 0.914    Lipid Panel No results found for: CHOL, TRIG, HDL, CHOLHDL, VLDL, LDLCALC, LDLDIRECT    Wt Readings from Last 3 Encounters:  04/19/19 143 lb 12.8 oz (65.2 kg)  06/30/17 133 lb 2 oz (60.4 kg)      Other studies Reviewed: Additional studies/ records that were reviewed today include: Holter  Review of the above records demonstrates:  Please see elsewhere in the note.     ASSESSMENT AND PLAN:  Tachycardia/palpitaitons:    She has brief runs of supraventricular tachycardia that likely are not related to the event that she had that took her to the emergency room.  That more than likely  it was sinus tachycardia related to some anxiety.  I cannot be sure that it was not supraventricular tachycardia of another kind however and we did discuss vagal maneuvers.  She can take as needed metoprolol 25 mg daily.  No other work-up is indicated at this point.   HTN: Blood pressures are well controlled.  No change in therapy.   Current medicines are reviewed at length with the patient today.  The patient does not have concerns regarding medicines.  The following changes have been made:  None    Labs/ tests ordered today include:  None  No orders of the defined types were placed in this encounter.    Disposition:   FU with me as needed.     Signed, Minus Breeding, MD  05/16/2019 9:04 PM    [place]

## 2019-05-17 ENCOUNTER — Other Ambulatory Visit: Payer: Self-pay

## 2019-05-17 ENCOUNTER — Encounter: Payer: Self-pay | Admitting: Cardiology

## 2019-05-17 ENCOUNTER — Ambulatory Visit: Payer: Medicare Other | Admitting: Cardiology

## 2019-05-17 VITALS — BP 152/79 | HR 98 | Temp 97.6°F | Ht 66.75 in | Wt 144.6 lb

## 2019-05-17 DIAGNOSIS — R000 Tachycardia, unspecified: Secondary | ICD-10-CM

## 2019-05-17 DIAGNOSIS — I10 Essential (primary) hypertension: Secondary | ICD-10-CM

## 2019-05-17 MED ORDER — METOPROLOL TARTRATE 25 MG PO TABS: 25.0000 mg | ORAL_TABLET | Freq: Two times a day (BID) | ORAL | 3 refills | Status: DC | PRN

## 2019-05-17 NOTE — Patient Instructions (Signed)
Medication Instructions:  Take Metoprolol 25mg  twice daily as needed.  If you need a refill on your cardiac medications before your next appointment, please call your pharmacy.   Lab work: NONE  Testing/Procedures: NONE  Follow-Up: As needed

## 2020-09-18 ENCOUNTER — Other Ambulatory Visit: Payer: Self-pay | Admitting: Obstetrics and Gynecology

## 2020-09-18 DIAGNOSIS — N6320 Unspecified lump in the left breast, unspecified quadrant: Secondary | ICD-10-CM

## 2020-10-01 ENCOUNTER — Other Ambulatory Visit: Payer: Self-pay | Admitting: Obstetrics and Gynecology

## 2020-10-01 ENCOUNTER — Ambulatory Visit
Admission: RE | Admit: 2020-10-01 | Discharge: 2020-10-01 | Disposition: A | Discharge status: Home / Self Care | Payer: Medicare Other | Source: Ambulatory Visit | Attending: Obstetrics and Gynecology | Admitting: Obstetrics and Gynecology

## 2020-10-01 ENCOUNTER — Other Ambulatory Visit: Payer: Self-pay

## 2020-10-01 DIAGNOSIS — N6320 Unspecified lump in the left breast, unspecified quadrant: Secondary | ICD-10-CM

## 2020-10-01 DIAGNOSIS — R921 Mammographic calcification found on diagnostic imaging of breast: Secondary | ICD-10-CM

## 2020-10-05 ENCOUNTER — Other Ambulatory Visit: Payer: Self-pay

## 2020-10-05 ENCOUNTER — Ambulatory Visit
Admission: RE | Admit: 2020-10-05 | Discharge: 2020-10-05 | Disposition: A | Discharge status: Home / Self Care | Payer: Medicare Other | Source: Ambulatory Visit | Attending: Obstetrics and Gynecology | Admitting: Obstetrics and Gynecology

## 2020-10-05 DIAGNOSIS — R921 Mammographic calcification found on diagnostic imaging of breast: Secondary | ICD-10-CM

## 2020-10-05 DIAGNOSIS — N6320 Unspecified lump in the left breast, unspecified quadrant: Secondary | ICD-10-CM

## 2020-10-08 ENCOUNTER — Other Ambulatory Visit: Payer: Self-pay | Admitting: Obstetrics and Gynecology

## 2020-10-08 DIAGNOSIS — R921 Mammographic calcification found on diagnostic imaging of breast: Secondary | ICD-10-CM

## 2020-10-09 ENCOUNTER — Ambulatory Visit
Admission: RE | Admit: 2020-10-09 | Discharge: 2020-10-09 | Disposition: A | Discharge status: Home / Self Care | Payer: Medicare Other | Source: Ambulatory Visit | Attending: Obstetrics and Gynecology | Admitting: Obstetrics and Gynecology

## 2020-10-09 ENCOUNTER — Other Ambulatory Visit: Payer: Self-pay

## 2020-10-09 DIAGNOSIS — R921 Mammographic calcification found on diagnostic imaging of breast: Secondary | ICD-10-CM

## 2020-10-16 ENCOUNTER — Ambulatory Visit: Payer: Self-pay | Admitting: Surgery

## 2020-10-16 DIAGNOSIS — C50912 Malignant neoplasm of unspecified site of left female breast: Secondary | ICD-10-CM

## 2020-10-16 NOTE — H&P (View-Only) (Signed)
Lindsay Leach Appointment: 10/16/2020 9:20 AM Location: [facility] Patient #: [id number] DOB: 03/19/1941 Married / Language: English / Race: White Female  History of Present Illness (Lindsay Leach A. Lindsay Alanis MD; 10/16/2020 9:41 AM) Patient words: Pleasant 79-year-old female with a history of right breast cancer in 2004 treated with mastectomy and chemotherapy in [place] presents with a new left breast mass. As evaluated by mammogram and ultrasound and core biopsy which showed a mass measuring 1.8 cm in the subareolar position with area of calcifications around it. The entire is about 2-1/2 cm. Core biopsy showed invasive lobular carcinoma grade 2 ER weakly positive PR negative HER-2/neu negative. She has a significant amount of bruising. No family history of breast cancer.                   79-year-old female with a palpable area of concern in the left breast. The patient does have a history of a remote excision in the left breast for atypical lobular hyperplasia.  EXAM: DIGITAL DIAGNOSTIC UNILATERAL LEFT MAMMOGRAM WITH TOMOSYNTHESIS AND CAD; ULTRASOUND LEFT BREAST LIMITED  TECHNIQUE: Left digital diagnostic mammography and breast tomosynthesis was performed. The images were evaluated with computer-aided detection.; Targeted ultrasound examination of the left breast was performed  COMPARISON: Previous exams.  ACR Breast Density Category b: There are scattered areas of fibroglandular density.  FINDINGS: Cc and MLO tomograms as well as spot compression magnification views were performed of the left breast. There is a mass with margin irregularity and associated calcifications in the retroareolar left breast, with the mass and calcifications all together measuring approximately 2 x 2.5 cm. This corresponds with the patient's palpable area of concern.  Physical examination reveals a firm masslike area in the immediate retroareolar left breast.  Targeted  ultrasound of the left breast was performed. There is an irregular hypoechoic mass containing calcifications in the left breast at 3 o'clock retroareolar measuring 1.2 x 1 x 1.3 cm. This corresponds well with the mass seen in the left breast at mammography.  No lymphadenopathy seen in the left axilla.  IMPRESSION: Suspicious palpable 1.3 cm mass in the left breast at 3 o'clock retroareolar with associated suspicious calcifications. The mass and calcifications all together measure 2.5 cm.  RECOMMENDATION: 1. Recommend ultrasound-guided biopsy of the mass in the left breast at 3 o'clock retroareolar with recommendation to perform this procedure first.  2. Recommend stereotactic guided biopsy of the calcifications furthest from the biopsied mass to determine extent of disease.  I have discussed the findings and recommendations with the patient. If applicable, a reminder letter will be sent to the patient regarding the next appointment.  BI-RADS CATEGORY 4: Suspicious.   Electronically Signed By: Lindsay Leach M.D. On: 10/01/2020 14:54 ADDITIONAL INFORMATION: FLUORESCENCE IN-SITU HYBRIDIZATION Results: GROUP 5: HER2 **NEGATIVE** Equivocal form of amplification of the HER2 gene was detected in the IHC 2+ tissue sample received from this individual. HER2 FISH was performed by a technologist and cell imaging and analysis on the Lindsay Leach. RATIO OF HER2/CEN17 SIGNALS 1.15 AVERAGE HER2 COPY NUMBER PER CELL 1.50 The ratio of HER2/CEN 17 is within the range < 2.0 of HER2/CEN 17 and a copy number of HER2 signals per cell is <4.0. Lindsay Leach 1:1,2018 Lindsay BUTLER MD Pathologist, Electronic Signature ( Signed 10/11/2020) PROGNOSTIC INDICATORS Results: IMMUNOHISTOCHEMICAL AND MORPHOMETRIC ANALYSIS PERFORMED MANUALLY The tumor cells are EQUIVOCAL for Her2 (2+). HER 2 by FISH will be performed and the results reported separately Estrogen Receptor: 10%, POSITIVE, WEAK  STAINING   INTENSITY Progesterone Receptor: 0%, NEGATIVE Proliferation Marker Ki67: 2% 1 of 3 Amended copy Addendum FINAL for Leach, Lindsay ([id number]) ADDITIONAL INFORMATION:(continued) COMMENT: The negative hormone receptor study(ies) in this case has an internal positive control. REFERENCE RANGE ESTROGEN RECEPTOR NEGATIVE 0% POSITIVE =>1% REFERENCE RANGE PROGESTERONE RECEPTOR NEGATIVE 0% POSITIVE =>1% All controls stained appropriately Lindsay BUTLER MD Pathologist, Electronic Signature ( Signed 10/11/2020) FINAL DIAGNOSIS Diagnosis Breast, left, needle core biopsy, 3 o'clock, retroareolar - INVASIVE MAMMARY CARCINOMA. MAMMARY CARCINOMA IN SITU. Microscopic Comment The carcinoma appears grade 2. The greatest linear extent of tumor in any one core is 5-6 mm. E-cadherin will be reported separately. Ancillary studies will be reported separately. Results reported to [facility] on 10/08/2020. Lindsay Leach reviewed the case. ADDENDUM: Immunohistochemistry for E-cadherin is negative supporting a lobular phenotype. Please note the in-situ component is pleomorphic. Lindsay Leach reviewed.       Diagnosis Breast, left, needle core biopsy, upper outer quadrant - INVASIVE LOBULAR CARCINOMA - LOBULAR CARCINOMA IN-SITU, PLEOMORPHIC FEATURES - MICROCALCIFICATIONS - SEE COMMENT Microscopic Comment Cytokeratin AE1/3 highlights the infiltrative epithelial proliferation. Cytokeratin 5/6 is negative in the carcinoma in-situ with retention of myoepithelial cells by SMM immunohistochemistry. Lindsay Leach reviewed the case and agrees with the above diagnosis. [facility] these results on October 11, 2020. Prognostic markers will be performed upon request (duplicate sample). Lindsay BUTLER MD Pathologist, Electronic Signature (Case signed 10/11/2020) Specimen.  The patient is a 79 year old female.   Past Surgical History (Lindsay Leach, CMA;  10/16/2020 8:58 AM) Breast Biopsy Bilateral. Colon Polyp Removal - Colonoscopy  Diagnostic Studies History (Lindsay Leach, CMA; 10/16/2020 8:58 AM) Colonoscopy 5-10 years ago Pap Smear 1-5 years ago  Allergies (Lindsay Leach, CMA; 10/16/2020 8:59 AM) Sulfa Drugs Advil *ANALGESICS - ANTI-INFLAMMATORY* Allergies Reconciled  Medication History (Lindsay Leach, CMA; 10/16/2020 9:00 AM) Boniva (150MG Tablet, Oral) Active. Magnesium (500MG Tablet, Oral) Active. Vitamin D3 (25 MCG(1000 UT) Tablet, Oral) Active. Centrum Silver Ultra Womens (Oral) Active. Multi-Vitamin/Minerals (Oral) Active. Medications Reconciled  Social History (Lindsay Leach, CMA; 10/16/2020 8:58 AM) Alcohol use Remotely quit alcohol use. No drug use Tobacco use Never smoker.  Family History (Lindsay Leach, CMA; 10/16/2020 8:58 AM) Respiratory Condition Father.  Pregnancy / Birth History (Lindsay Leach, CMA; 10/16/2020 8:58 AM) Age of menopause [age] Contraceptive History Intrauterine device, Oral contraceptives. Length (months) of breastfeeding 3-6 Maternal age 21-25 Para 2  Other Problems (Lindsay Leach, CMA; 10/16/2020 8:58 AM) Breast Cancer Lump In Breast Other disease, cancer, significant illness Pancreatitis     Review of Systems (Lindsay Leach CMA; 10/16/2020 8:58 AM) General Not Present- Appetite Loss, Chills, Fatigue, Fever, Night Sweats, Weight Gain and Weight Loss. Skin Not Present- Change in Wart/Mole, Dryness, Hives, Jaundice, New Lesions, Non-Healing Wounds, Rash and Ulcer. HEENT Present- Ringing in the Ears and Wears glasses/contact lenses. Not Present- Earache, Hearing Loss, Hoarseness, Nose Bleed, Oral Ulcers, Seasonal Allergies, Sinus Pain, Sore Throat, Visual Disturbances and Yellow Eyes. Breast Present- Breast Mass. Not Present- Breast Pain, Nipple Discharge and Skin Changes. Musculoskeletal  Not Present- Back Pain, Joint Pain, Joint Stiffness, Muscle Pain, Muscle Weakness and Swelling of Extremities. Neurological Not Present- Decreased Memory, Fainting, Headaches, Numbness, Seizures, Tingling, Tremor, Trouble walking and Weakness.  Vitals (Lindsay Leach CMA; 10/16/2020 9:01 AM) 10/16/2020 9:00 AM Weight: 142.13 lb Height: 68in Body Surface Area: 1.77 m Body Mass Index: 21.61 kg/m  Temp.: 97.6F  Pulse: 120 (Regular)  P.OX: 97% (Room air) BP: 130/74(Sitting, Left Arm, Standard)          Physical Exam (Adalaide Jaskolski A. Ledford Goodson MD; 10/16/2020 9:42 AM)  General Mental Status-Alert. General Appearance-Consistent with stated age. Hydration-Well hydrated. Voice-Normal.  Chest and Lung Exam Chest and lung exam reveals -quiet, even and easy respiratory effort with no use of accessory muscles and on auscultation, normal breath sounds, no adventitious sounds and normal vocal resonance. Inspection Chest Wall - Normal. Back - normal.  Breast Note: Right breast surgically absent. No masses. Left breast shows significant ecchymoses and hematoma. Left axilla is normal.  Cardiovascular Cardiovascular examination reveals -normal heart sounds, regular rate and rhythm with no murmurs and normal pedal pulses bilaterally.  Neurologic Neurologic evaluation reveals -alert and oriented x 3 with no impairment of recent or remote memory. Mental Status-Normal.  Lymphatic Axillary  General Axillary Region: Bilateral - Description - Normal. Tenderness - Non Tender.    Assessment & Plan (Marqueta Pulley A. Sally Menard MD; 10/16/2020 9:43 AM)  BREAST CANCER, LEFT (C50.912) Impression: Invasive lobular carcinoma-discussed breast conserving surgery and left simple mastectomy. Pros and cons of each were discussed. She is opted for left simple mastectomy with sentinel lymph node mapping. Refer to medical oncology Referred to physical therapy since she does have some shoulder  stiffness she states for preop assessment and for assessment of potential lymphedema which I discussed with her today. Risk of sentinel lymph node mapping include bleeding, infection, lymphedema, shoulder pain. stiffness, dye allergy. cosmetic deformity , blood clots, death, need for more surgery. Pt agres to proceed. Discussed treatment options for breast cancer to include breast conservation vs mastectomy with reconstruction. Pt has decided on mastectomy. Risk include bleeding, infection, flap necrosis, pain, numbness, recurrence, hematoma, other surgery needs. Pt understands and agrees to proceed.  Total time 45 minutes for face-to-face time, examination, counseling, reviewing literature, review of pathology, review of mammogram, and time for documentation  Current Plans You are being scheduled for surgery- Our schedulers will call you.  You should hear from our office's scheduling department within 5 working days about the location, date, and time of surgery. We try to make accommodations for patient's preferences in scheduling surgery, but sometimes the OR schedule or the surgeon's schedule prevents us from making those accommodations.  If you have not heard from our office ([phone]) in 5 working days, call the office and ask for your surgeon's nurse.  If you have other questions about your diagnosis, plan, or surgery, call the office and ask for your surgeon's nurse.  Pt Education - [facility] breast cancer surgery: discussed with patient and provided information. Pt Education - [facility] Mastectomy [facility] Pt Education - ABC (After Breast Cancer) Class Info: discussed with patient and provided information. 

## 2020-10-16 NOTE — H&P (Signed)
Lindsay Leach Appointment: 10/16/2020 9:20 AM Location: [facility] Patient #: [id number] DOB: [birth year] Married / Language: English / Race: White Female  History of Present Illness Lindsay Moores A. [name] MD; 10/16/2020 9:41 AM) Patient words: Pleasant 80 year old female with a history of right breast cancer in 2004 treated with mastectomy and chemotherapy in [place] presents with a new left breast mass. As evaluated by mammogram and ultrasound and core biopsy which showed a mass measuring 1.8 cm in the subareolar position with area of calcifications around it. The entire is about 2-1/2 cm. Core biopsy showed invasive lobular carcinoma grade 2 ER weakly positive PR negative HER-2/neu negative. She has a significant amount of bruising. No family history of breast cancer.                   80 year old female with a palpable area of concern in the left breast. The patient does have a history of a remote excision in the left breast for atypical lobular hyperplasia.  EXAM: DIGITAL DIAGNOSTIC UNILATERAL LEFT MAMMOGRAM WITH TOMOSYNTHESIS AND CAD; ULTRASOUND LEFT BREAST LIMITED  TECHNIQUE: Left digital diagnostic mammography and breast tomosynthesis was performed. The images were evaluated with computer-aided detection.; Targeted ultrasound examination of the left breast was performed  COMPARISON: Previous exams.  ACR Breast Density Category b: There are scattered areas of fibroglandular density.  FINDINGS: Cc and MLO tomograms as well as spot compression magnification views were performed of the left breast. There is a mass with margin irregularity and associated calcifications in the retroareolar left breast, with the mass and calcifications all together measuring approximately 2 x 2.5 cm. This corresponds with the patient's palpable area of concern.  Physical examination reveals a firm masslike area in the immediate retroareolar left breast.  Targeted  ultrasound of the left breast was performed. There is an irregular hypoechoic mass containing calcifications in the left breast at 3 o'clock retroareolar measuring 1.2 x 1 x 1.3 cm. This corresponds well with the mass seen in the left breast at mammography.  No lymphadenopathy seen in the left axilla.  IMPRESSION: Suspicious palpable 1.3 cm mass in the left breast at 3 o'clock retroareolar with associated suspicious calcifications. The mass and calcifications all together measure 2.5 cm.  RECOMMENDATION: 1. Recommend ultrasound-guided biopsy of the mass in the left breast at 3 o'clock retroareolar with recommendation to perform this procedure first.  2. Recommend stereotactic guided biopsy of the calcifications furthest from the biopsied mass to determine extent of disease.  I have discussed the findings and recommendations with the patient. If applicable, a reminder letter will be sent to the patient regarding the next appointment.  BI-RADS CATEGORY 4: Suspicious.   Electronically Signed By: Everlean Alstrom M.D. On: 10/01/2020 14:54 ADDITIONAL INFORMATION: FLUORESCENCE IN-SITU HYBRIDIZATION Results: GROUP 5: HER2 **NEGATIVE** Equivocal form of amplification of the HER2 gene was detected in the IHC 2+ tissue sample received from this individual. HER2 FISH was performed by a technologist and cell imaging and analysis on the [employer]. RATIO OF HER2/CEN17 SIGNALS 1.15 AVERAGE HER2 COPY NUMBER PER CELL 1.50 The ratio of HER2/CEN 17 is within the range < 2.0 of HER2/CEN 17 and a copy number of HER2 signals per cell is <4.0. Arch Pathol Lab Med 1:1,2018 Lindsay Sheller MD Pathologist, Electronic Signature ( Signed 10/11/2020) PROGNOSTIC INDICATORS Results: IMMUNOHISTOCHEMICAL AND MORPHOMETRIC ANALYSIS PERFORMED MANUALLY The tumor cells are EQUIVOCAL for Her2 (2+). HER 2 by FISH will be performed and the results reported separately Estrogen Receptor: 10%, POSITIVE, WEAK  STAINING  INTENSITY Progesterone Receptor: 0%, NEGATIVE Proliferation Marker Ki67: 2% 1 of 3 Amended copy Addendum FINAL for Lindsay Leach, Lindsay Leach [phone]) ADDITIONAL INFORMATION:(continued) COMMENT: The negative hormone receptor study(ies) in this case has an internal positive control. REFERENCE RANGE ESTROGEN RECEPTOR NEGATIVE 0% POSITIVE =>1% REFERENCE RANGE PROGESTERONE RECEPTOR NEGATIVE 0% POSITIVE =>1% All controls stained appropriately Lindsay Sheller MD Pathologist, Electronic Signature ( Signed 10/11/2020) FINAL DIAGNOSIS Diagnosis Breast, left, needle core biopsy, 3 o'clock, retroareolar - INVASIVE MAMMARY CARCINOMA. MAMMARY CARCINOMA IN SITU. Microscopic Comment The carcinoma appears grade 2. The greatest linear extent of tumor in any one core is 5-6 mm. E-cadherin will be reported separately. Ancillary studies will be reported separately. Results reported to [facility] on 10/08/2020. Lindsay Leach reviewed the case. ADDENDUM: Immunohistochemistry for E-cadherin is negative supporting a lobular phenotype. Please note the in-situ component is pleomorphic. Lindsay Leach reviewed.       Diagnosis Breast, left, needle core biopsy, upper outer quadrant - INVASIVE LOBULAR CARCINOMA - LOBULAR CARCINOMA IN-SITU, PLEOMORPHIC FEATURES - MICROCALCIFICATIONS - SEE COMMENT Microscopic Comment Cytokeratin AE1/3 highlights the infiltrative epithelial proliferation. Cytokeratin 5/6 is negative in the carcinoma in-situ with retention of myoepithelial cells by [facility] immunohistochemistry. Lindsay Leach reviewed the case and agrees with the above diagnosis. [facility] these results on October 11, 2020. Prognostic markers will be performed upon request (duplicate sample). Lindsay Sheller MD Pathologist, Electronic Signature (Case signed 10/11/2020) Specimen.  The patient is a 80 year old female.   Past Surgical History Lindsay Leach, Laureles;  10/16/2020 8:58 AM) Breast Biopsy Bilateral. Colon Polyp Removal - Colonoscopy  Diagnostic Studies History (Lindsay Leach, CMA; 10/16/2020 8:58 AM) Colonoscopy 5-10 years ago Pap Smear 1-5 years ago  Allergies (Lindsay Leach, CMA; 10/16/2020 8:59 AM) Sulfa Drugs Advil *ANALGESICS - ANTI-INFLAMMATORY* Allergies Reconciled  Medication History (Lindsay Leach, CMA; 10/16/2020 9:00 AM) Boniva (150MG Tablet, Oral) Active. Magnesium (500MG Tablet, Oral) Active. Vitamin D3 (25 MCG(1000 UT) Tablet, Oral) Active. Centrum Silver Ultra Womens (Oral) Active. Multi-Vitamin/Minerals (Oral) Active. Medications Reconciled  Social History Lindsay Leach, CMA; 10/16/2020 8:58 AM) Alcohol use Remotely quit alcohol use. No drug use Tobacco use Never smoker.  Family History Lindsay Leach, CMA; 10/16/2020 8:58 AM) Respiratory Condition Father.  Pregnancy / Birth History Lindsay Leach, CMA; 10/16/2020 8:58 AM) Age of menopause 60-60 Contraceptive History Intrauterine device, Oral contraceptives. Length (months) of breastfeeding 3-6 Maternal age 75-25 Para 2  Other Problems Lindsay Leach, CMA; 10/16/2020 8:58 AM) Breast Cancer Lump In Breast Other disease, cancer, significant illness Pancreatitis     Review of Systems (Lindsay Leach CMA; 10/16/2020 8:58 AM) General Not Present- Appetite Loss, Chills, Fatigue, Fever, Night Sweats, Weight Gain and Weight Loss. Skin Not Present- Change in Wart/Mole, Dryness, Hives, Jaundice, New Lesions, Non-Healing Wounds, Rash and Ulcer. HEENT Present- Ringing in the Ears and Wears glasses/contact lenses. Not Present- Earache, Hearing Loss, Hoarseness, Nose Bleed, Oral Ulcers, Seasonal Allergies, Sinus Pain, Sore Throat, Visual Disturbances and Yellow Eyes. Breast Present- Breast Mass. Not Present- Breast Pain, Nipple Discharge and Skin Changes. Musculoskeletal  Not Present- Back Pain, Joint Pain, Joint Stiffness, Muscle Pain, Muscle Weakness and Swelling of Extremities. Neurological Not Present- Decreased Memory, Fainting, Headaches, Numbness, Seizures, Tingling, Tremor, Trouble walking and Weakness.  Vitals (Lindsay Leach CMA; 10/16/2020 9:01 AM) 10/16/2020 9:00 AM Weight: 142.13 lb Height: 68in Body Surface Area: 1.77 m Body Mass Index: 21.61 kg/m  Temp.: 97.89F  Pulse: 120 (Regular)  P.OX: 97% (Room air) BP: 130/74(Sitting, Left Arm, Standard)  Physical Exam (Lindsay Lorenzi A. Wynnie Pacetti MD; 10/16/2020 9:42 AM)  General Mental Status-Alert. General Appearance-Consistent with stated age. Hydration-Well hydrated. Voice-Normal.  Chest and Lung Exam Chest and lung exam reveals -quiet, even and easy respiratory effort with no use of accessory muscles and on auscultation, normal breath sounds, no adventitious sounds and normal vocal resonance. Inspection Chest Wall - Normal. Back - normal.  Breast Note: Right breast surgically absent. No masses. Left breast shows significant ecchymoses and hematoma. Left axilla is normal.  Cardiovascular Cardiovascular examination reveals -normal heart sounds, regular rate and rhythm with no murmurs and normal pedal pulses bilaterally.  Neurologic Neurologic evaluation reveals -alert and oriented x 3 with no impairment of recent or remote memory. Mental Status-Normal.  Lymphatic Axillary  General Axillary Region: Bilateral - Description - Normal. Tenderness - Non Tender.    Assessment & Plan (Lindsay Wernick A. Krissie Merrick MD; 10/16/2020 9:43 AM)  BREAST CANCER, LEFT (C50.912) Impression: Invasive lobular carcinoma-discussed breast conserving surgery and left simple mastectomy. Pros and cons of each were discussed. She is opted for left simple mastectomy with sentinel lymph node mapping. Refer to medical oncology Referred to physical therapy since she does have some shoulder  stiffness she states for preop assessment and for assessment of potential lymphedema which I discussed with her today. Risk of sentinel lymph node mapping include bleeding, infection, lymphedema, shoulder pain. stiffness, dye allergy. cosmetic deformity , blood clots, death, need for more surgery. Pt agres to proceed. Discussed treatment options for breast cancer to include breast conservation vs mastectomy with reconstruction. Pt has decided on mastectomy. Risk include bleeding, infection, flap necrosis, pain, numbness, recurrence, hematoma, other surgery needs. Pt understands and agrees to proceed.  Total time 45 minutes for face-to-face time, examination, counseling, reviewing literature, review of pathology, review of mammogram, and time for documentation  Current Plans You are being scheduled for surgery- Our schedulers will call you.  You should hear from our office's scheduling department within 5 working days about the location, date, and time of surgery. We try to make accommodations for patient's preferences in scheduling surgery, but sometimes the OR schedule or the surgeon's schedule prevents [place] from making those accommodations.  If you have not heard from our office [phone]) in 5 working days, call the office and ask for your surgeon's nurse.  If you have other questions about your diagnosis, plan, or surgery, call the office and ask for your surgeon's nurse.  Pt Education - [facility] breast cancer surgery: discussed with patient and provided information. Pt Education - [facility] Mastectomy [facility] Pt Education - ABC (After Breast Cancer) Class Info: discussed with patient and provided information.

## 2020-10-17 ENCOUNTER — Encounter: Payer: Self-pay | Admitting: Adult Health

## 2020-10-17 DIAGNOSIS — Z171 Estrogen receptor negative status [ER-]: Secondary | ICD-10-CM | POA: Insufficient documentation

## 2020-10-17 DIAGNOSIS — C50112 Malignant neoplasm of central portion of left female breast: Secondary | ICD-10-CM | POA: Insufficient documentation

## 2020-10-29 NOTE — Progress Notes (Signed)
[place]  [phone] [phone]     ID: Lindsay Leach DOB: January 23, 1941  MR#: [id number]  PFX#:[id number]  Reynold Bowen MD Erroll Luna MD Marylynn Pearson MD Chauncey Cruel, MD OTHER MD:  CHIEF COMPLAINT: functionally triple negative lobular breast cancer  CURRENT TREATMENT: Awaiting definitive surgery   HISTORY OF CURRENT ILLNESS: Lindsay Leach has a remote history of right breast cancer in 2004, treated with mastectomy and chemotherapy in [place] (details below).  She recently palpated a left breast area of concern. She underwent left diagnostic mammography with tomography and left breast ultrasonography at [facility] on 10/01/2020 showing: breast density category B; palpable 1.3 cm mass in left breast at 3 o'clock retroareolar with associated suspicious calcifications, all together measuring 2.5 cm; no lymphadenopathy in left axilla.  Accordingly on 10/05/2020 she proceeded to biopsies of the left breast area in question. The pathology from this procedures showed:  1. Left Breast, UOQ ([id number])  - microscopic focus of in situ and invasive mammary carcinoma 2. Left Breast, 3 o'clock retroareolar ([id number])  - invasive and in situ mammary carcinoma, e-cadherin negative, grade 2  - Prognostic indicators significant for: estrogen receptor, 10% positive with weak staining intensity and progesterone receptor, 0% negative. Proliferation marker Ki67 at 2%. HER2 equivocal by immunohistochemistry (2+), but negative by fluorescent in situ hybridization with a signals ratio 1.15 and number per cell 1.5.  While the first sample was felt to be concordant, no calcifications were seen in the second specimen and the tissue was likely obtained between the calcifications and the retroareolar mass. She returned on 10/09/2020 for repeat biopsy of the calcifications in the upper-outer left breast. Pathology [id number]) confirmed: invasive lobular  carcinoma; lobular carcinoma in situ, pleomorphic features; microcalcifications.  Cancer Staging Malignant neoplasm of central portion of left breast in female, estrogen receptor negative ([place]) Staging form: Breast, AJCC 8th Edition - Clinical stage from 10/09/2020: Stage IIB (cT2, cN0, cM0, G2, ER-, PR-, HER2-) - Signed by Chauncey Cruel, MD on 10/30/2020 Histologic grading system: 3 grade system  The patient's subsequent history is as detailed below.   INTERVAL HISTORY: [name] was evaluated in the breast cancer clinic on 10/30/2020. Her case was also presented at the multidisciplinary breast cancer conference on 10/18/2019. At that time a preliminary plan was proposed: Left mastectomy and lymph node mapping consider observation versus chemotherapy or other treatment, plus or minus adjuvant radiation and genetics  She is scheduled for left mastectomy on 11/13/2020 under Dr. Brantley Stage.   REVIEW OF SYSTEMS: The patient denies unusual headaches, visual changes, nausea, vomiting, stiff neck, dizziness, or gait imbalance. There has been no cough, phlegm production, or pleurisy, no chest pain or pressure, and no change in bowel or bladder habits. The patient denies fever, rash, bleeding, unexplained fatigue or unexplained weight loss.  She walks for exercise and does in a 9minute stretching program most mornings.  A detailed review of systems was otherwise entirely negative.   COVID 19 VACCINATION STATUS: Status post Moderna x2 with booster  PAST MEDICAL HISTORY: Past Medical History:  Diagnosis Date  . Breast cancer ([place]) 2004   right  . Colon polyps   . Diverticulosis   . Osteoporosis   . Pancreatitis     PAST SURGICAL HISTORY: Past Surgical History:  Procedure Laterality Date  . CYST EXCISION  2005   [place]  . IR SCLEROTHERAPY OF A FLUID COLLECTION  200, 2006  . MODIFIED RADICAL MASTECTOMY Right 2004  . Phlebectomies  1998  .  VARICOSE VEIN SURGERY Bilateral 1970  Status post  right modified radical mastectomy  FAMILY HISTORY: Family History  Problem Relation Age of Onset  . Lung cancer Father   . Diabetes Maternal Grandfather   . Colon cancer Maternal Uncle    There is no family history of breast or ovarian cancer to her knowledge.  One maternal uncle had colon cancer at advanced age. --The patient tells me she had genetics testing through Dr. Orvan Seen and no mutations were found.   GYNECOLOGIC HISTORY:  No LMP recorded. Patient is postmenopausal. Menarche: 80 years old Age at first live birth: 80 years old [employer] P 2 LMP 61 Contraceptive: used pills and IUD HRT more than 10 years  Hysterectomy? no BSO? 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  At home is just she and her husband Timmothy Sours who is a retired Hospital doctor.  He taught at the [employer] of [place].  He now has some Parkinson's disease.  The patient moved to this area to be closer to her daughter Suella Grove who works in a Runner, broadcasting/film/video.  Horris Latino has 2 sons, Izola Price and Cristie Hem, who are identical twins, Izola Price going to middle [place] and Cristie Hem going to college in [place].  Daughter Marlowe Kays is a homemaker in [place] she has a daughter Ovid Curd who is studying to be a Marine scientist and his son Aaron Edelman who is a Museum/gallery exhibitions officer in college.  The patient is a [employer].  ADVANCED DIRECTIVES: In the absence of any documentation to the contrary, the patient's spouse is their HCPOA.    HEALTH MAINTENANCE: Social History   Tobacco Use  . Smoking status: Never Smoker  . Smokeless tobacco: Never Used  Substance Use Topics  . Alcohol use: No  . Drug use: No     Colonoscopy: 02/2014  PAP:  Bone density:    Allergies  Allergen Reactions  . Advil [Ibuprofen] Rash  . Sulfa Antibiotics Nausea Only    Current Outpatient Medications  Medication Sig Dispense Refill  . cholecalciferol (VITAMIN D) 1000 units tablet Take 1,000 Units by mouth daily.    Marland Kitchen ibandronate (BONIVA) 150 MG  tablet Take 150 mg by mouth every 30 (thirty) days.    . magnesium gluconate (MAGONATE) 500 MG tablet Take 500 mg by mouth daily.    . Multiple Vitamins-Minerals (CENTRUM SILVER PO) Take 1 tablet by mouth daily.     No current facility-administered medications for this visit.    OBJECTIVE: White woman who appears stated age  80:   10/30/20 1551  BP: (!) 158/90  Pulse: (!) 114  Resp: 18  Temp: (!) 97.4 F (36.3 C)  SpO2: 96%     Body mass index is 22.3 kg/m.   Wt Readings from Last 3 Encounters:  10/30/20 141 lb 4.8 oz (64.1 kg)  05/17/19 144 lb 9.6 oz (65.6 kg)  04/19/19 143 lb 12.8 oz (65.2 kg)      ECOG FS:1 - Symptomatic but completely ambulatory  Ocular: Sclerae unicteric, pupils round and equal Ear-nose-throat: Wearing a mask Lymphatic: No cervical or supraclavicular adenopathy Lungs no rales or rhonchi Heart regular rate and rhythm Abd soft, nontender, positive bowel sounds MSK scoliosis but no focal spinal tenderness, no joint edema Neuro: non-focal, well-oriented, appropriate affect Breasts: The right breast is status post mastectomy.  There is no evidence of local recurrence.  The left breast is status post recent biopsy.  There is a moderate ecchymosis.  The  left axilla is benign.   LAB RESULTS:  CMP     Component Value Date/Time   NA 141 10/30/2020 1521   K 4.1 10/30/2020 1521   CL 102 10/30/2020 1521   CO2 28 10/30/2020 1521   GLUCOSE 117 (H) 10/30/2020 1521   BUN 14 10/30/2020 1521   CREATININE 0.74 10/30/2020 1521   CALCIUM 9.4 10/30/2020 1521   PROT 7.5 10/30/2020 1521   ALBUMIN 4.4 10/30/2020 1521   AST 20 10/30/2020 1521   ALT 15 10/30/2020 1521   ALKPHOS 76 10/30/2020 1521   BILITOT 0.5 10/30/2020 1521   GFRNONAA >60 10/30/2020 1521   GFRAA >60 04/15/2019 1409    No results found for: TOTALPROTELP, ALBUMINELP, A1GS, A2GS, BETS, BETA2SER, GAMS, MSPIKE, SPEI  Lab Results  Component Value Date   WBC 4.9 10/30/2020   NEUTROABS 2.8  10/30/2020   HGB 12.9 10/30/2020   HCT 38.8 10/30/2020   MCV 92.6 10/30/2020   PLT 214 10/30/2020    No results found for: LABCA2  No components found for: NATFTD322  No results for input(s): INR in the last 168 hours.  No results found for: LABCA2  No results found for: GUR427  No results found for: CWC376  No results found for: EGB151  No results found for: CA2729  No components found for: HGQUANT  No results found for: CEA1 / No results found for: CEA1   No results found for: AFPTUMOR  No results found for: CHROMOGRNA  No results found for: KPAFRELGTCHN, LAMBDASER, KAPLAMBRATIO (kappa/lambda light chains)  No results found for: HGBA, HGBA2QUANT, HGBFQUANT, HGBSQUAN (Hemoglobinopathy evaluation)   No results found for: LDH  No results found for: IRON, TIBC, IRONPCTSAT (Iron and TIBC)  No results found for: FERRITIN  Urinalysis    Component Value Date/Time   COLORURINE STRAW (A) 04/15/2019 1437   APPEARANCEUR CLEAR 04/15/2019 1437   LABSPEC 1.005 04/15/2019 1437   PHURINE 7.0 04/15/2019 1437   GLUCOSEU NEGATIVE 04/15/2019 1437   HGBUR SMALL (A) 04/15/2019 1437   BILIRUBINUR NEGATIVE 04/15/2019 1437   KETONESUR NEGATIVE 04/15/2019 1437   PROTEINUR NEGATIVE 04/15/2019 1437   NITRITE NEGATIVE 04/15/2019 1437   LEUKOCYTESUR NEGATIVE 04/15/2019 1437     STUDIES: US BREAST LTD UNI LEFT INC AXILLA  Result Date: 10/01/2020 CLINICAL DATA:  80 year old female with a palpable area of concern in the left breast. The patient does have a history of a remote excision in the left breast for atypical lobular hyperplasia. EXAM: DIGITAL DIAGNOSTIC UNILATERAL LEFT MAMMOGRAM WITH TOMOSYNTHESIS AND CAD; ULTRASOUND LEFT BREAST LIMITED TECHNIQUE: Left digital diagnostic mammography and breast tomosynthesis was performed. The images were evaluated with computer-aided detection.; Targeted ultrasound examination of the left breast was performed COMPARISON:  Previous exams. ACR  Breast Density Category b: There are scattered areas of fibroglandular density. FINDINGS: Cc and MLO tomograms as well as spot compression magnification views were performed of the left breast. There is a mass with margin irregularity and associated calcifications in the retroareolar left breast, with the mass and calcifications all together measuring approximately 2 x 2.5 cm. This corresponds with the patient's palpable area of concern. Physical examination reveals a firm masslike area in the immediate retroareolar left breast. Targeted ultrasound of the left breast was performed. There is an irregular hypoechoic mass containing calcifications in the left breast at 3 o'clock retroareolar measuring 1.2 x 1 x 1.3 cm. This corresponds well with the mass seen in the left breast at mammography. No lymphadenopathy seen in the left  axilla. IMPRESSION: Suspicious palpable 1.3 cm mass in the left breast at 3 o'clock retroareolar with associated suspicious calcifications. The mass and calcifications all together measure 2.5 cm. RECOMMENDATION: 1. Recommend ultrasound-guided biopsy of the mass in the left breast at 3 o'clock retroareolar with recommendation to perform this procedure first. 2. Recommend stereotactic guided biopsy of the calcifications furthest from the biopsied mass to determine extent of disease. I have discussed the findings and recommendations with the patient. If applicable, a reminder letter will be sent to the patient regarding the next appointment. BI-RADS CATEGORY  4: Suspicious. Electronically Signed   By: Everlean Alstrom M.D.   On: 10/01/2020 14:54   MM DIAG BREAST TOMO UNI LEFT  Result Date: 10/01/2020 CLINICAL DATA:  80 year old female with a palpable area of concern in the left breast. The patient does have a history of a remote excision in the left breast for atypical lobular hyperplasia. EXAM: DIGITAL DIAGNOSTIC UNILATERAL LEFT MAMMOGRAM WITH TOMOSYNTHESIS AND CAD; ULTRASOUND LEFT BREAST  LIMITED TECHNIQUE: Left digital diagnostic mammography and breast tomosynthesis was performed. The images were evaluated with computer-aided detection.; Targeted ultrasound examination of the left breast was performed COMPARISON:  Previous exams. ACR Breast Density Category b: There are scattered areas of fibroglandular density. FINDINGS: Cc and MLO tomograms as well as spot compression magnification views were performed of the left breast. There is a mass with margin irregularity and associated calcifications in the retroareolar left breast, with the mass and calcifications all together measuring approximately 2 x 2.5 cm. This corresponds with the patient's palpable area of concern. Physical examination reveals a firm masslike area in the immediate retroareolar left breast. Targeted ultrasound of the left breast was performed. There is an irregular hypoechoic mass containing calcifications in the left breast at 3 o'clock retroareolar measuring 1.2 x 1 x 1.3 cm. This corresponds well with the mass seen in the left breast at mammography. No lymphadenopathy seen in the left axilla. IMPRESSION: Suspicious palpable 1.3 cm mass in the left breast at 3 o'clock retroareolar with associated suspicious calcifications. The mass and calcifications all together measure 2.5 cm. RECOMMENDATION: 1. Recommend ultrasound-guided biopsy of the mass in the left breast at 3 o'clock retroareolar with recommendation to perform this procedure first. 2. Recommend stereotactic guided biopsy of the calcifications furthest from the biopsied mass to determine extent of disease. I have discussed the findings and recommendations with the patient. If applicable, a reminder letter will be sent to the patient regarding the next appointment. BI-RADS CATEGORY  4: Suspicious. Electronically Signed   By: Everlean Alstrom M.D.   On: 10/01/2020 14:54   MM CLIP PLACEMENT LEFT  Result Date: 10/09/2020 CLINICAL DATA:  Status post stereotactic biopsy today  for indeterminate calcifications within the outer LEFT breast. Stereotactic biopsy attempt for these same calcifications was unsuccessful on 10/05/2020 due to equipment failure. Status post ultrasound-guided biopsy on 10/05/2020 for retroareolar LEFT breast mass revealing malignancy. EXAM: DIAGNOSTIC LEFT MAMMOGRAM POST STEREOTACTIC BIOPSY COMPARISON:  Previous exam(s). FINDINGS: Mammographic images were obtained following stereotactic guided biopsy of calcifications within the slightly upper outer quadrant of the LEFT breast. The biopsy marking clip is displaced by hematoma approximately 4 mm medial and 7 mm inferior to today's biopsy site. IMPRESSION: 1. Coil shaped clip placed for today's stereotactic biopsy is displaced approximately 4 mm medial and 7 mm inferior to today's biopsy site. 2. If localization becomes necessary for breast conservation surgery, recommend localization/excision to include the coil shaped clip and the overlying/adjacent residual calcifications. 3.  Heart shaped clip within the retroareolar LEFT breast corresponds to ultrasound-guided biopsy site of 10/05/2020 which revealed malignancy. Final Assessment: Post Procedure Mammograms for Marker Placement Electronically Signed   By: Franki Cabot M.D.   On: 10/09/2020 12:29   MM CLIP PLACEMENT LEFT  Result Date: 10/05/2020 CLINICAL DATA:  Status post ultrasound-guided biopsy of a retroareolar mass in the LEFT breast. EXAM: DIAGNOSTIC LEFT MAMMOGRAM POST ULTRASOUND BIOPSY COMPARISON:  Previous exam(s). FINDINGS: Mammographic images were obtained following ultrasound guided biopsy of the mass within the retroareolar LEFT breast, 3 o'clock axis. The biopsy marking clip is in expected position at the site of biopsy. IMPRESSION: Appropriate positioning of the heart shaped biopsy marking clip at the site of biopsy in the retroareolar LEFT breast. Final Assessment: Post Procedure Mammograms for Marker Placement Electronically Signed   By: Franki Cabot M.D.   On: 10/05/2020 12:19   MM LT BREAST BX W LOC DEV 1ST LESION IMAGE BX SPEC STEREO GUIDE  Addendum Date: 10/11/2020   ADDENDUM REPORT: 10/11/2020 14:42 ADDENDUM: Pathology revealed INVASIVE LOBULAR CARCINOMA, LOBULAR CARCINOMA IN SITU, PLEOMORPHIC FEATURES, MICROCALCIFICATIONS of the LEFT breast, upper outer quadrant (coil clip). This was found to be concordant by Dr. Franki Cabot. Pathology results were discussed with the patient by telephone. The patient reported doing well after the biopsy with tenderness at the site. Post biopsy instructions and care were reviewed and questions were answered. The patient was encouraged to call [facility] for any additional concerns. The patient has a recent diagnosis of left breast cancer and should follow her outlined treatment plan. This represents patient's second site of diagnosed cancer in the LEFT breast. There are additional residual calcifications within the outer LEFT breast, peripheral to the coil shaped clip, which should be used for localization purposes if breast conservation surgery is performed. Pathology results reported by Stacie Acres RN on 10/11/2020. Electronically Signed   By: Franki Cabot M.D.   On: 10/11/2020 14:42   Result Date: 10/11/2020 CLINICAL DATA:  Patient had an unsuccessful attempt at stereotactic biopsy on 10/05/2020 (equipment failure). Patient returns today for repeat stereotactic biopsy for the indeterminate calcifications in the outer LEFT breast. Patient did have a successful ultrasound-guided biopsy on 10/05/2020 revealing malignancy. EXAM: LEFT BREAST STEREOTACTIC CORE NEEDLE BIOPSY COMPARISON:  Previous exams. FINDINGS: The patient and I discussed the procedure of stereotactic-guided biopsy including benefits and alternatives. We discussed the high likelihood of a successful procedure. We discussed the risks of the procedure including infection, bleeding, tissue injury, clip migration, and  inadequate sampling. Informed written consent was given. The usual time out protocol was performed immediately prior to the procedure. Using sterile technique and 1% Lidocaine as local anesthetic, under stereotactic guidance, a 9 gauge vacuum assisted device was used to perform core needle biopsy of calcifications in the slightly upper outer quadrant of the left breast using a superior approach. Specimen radiograph was performed showing calcifications. Specimens with calcifications are identified for pathology. Lesion quadrant: Upper outer quadrant At the conclusion of the procedure, coil shaped tissue marker clip was deployed into the biopsy cavity. Follow-up 2-view mammogram was performed and dictated separately. IMPRESSION: Stereotactic-guided biopsy of indeterminate calcifications within the slightly upper outer quadrant of the LEFT breast. No apparent complications. Electronically Signed: By: Franki Cabot M.D. On: 10/09/2020 12:18   MM LT BREAST BX W LOC DEV 1ST LESION IMAGE BX SPEC STEREO GUIDE  Result Date: 10/05/2020 CLINICAL DATA:  Patient with indeterminate calcifications in the upper-outer  quadrant LEFT breast presents today for. Stereotactic biopsy. Ultrasound-guided biopsy was performed earlier today for a mass in the retroareolar LEFT breast. EXAM: LEFT BREAST STEREOTACTIC CORE NEEDLE BIOPSY COMPARISON:  Previous exams. FINDINGS: The patient and I discussed the procedure of stereotactic-guided biopsy including benefits and alternatives. We discussed the high likelihood of a successful procedure. We discussed the risks of the procedure including infection, bleeding, tissue injury, clip migration, and inadequate sampling. Informed written consent was given. The usual time out protocol was performed immediately prior to the procedure. Using sterile technique and 1% Lidocaine as local anesthetic, under stereotactic guidance, a 9 gauge vacuum assisted device was used to perform core needle biopsy of  calcifications in the upper outer quadrant of the left breast using a superior approach. Due to equipment failure, adequate samples were not able to be obtained. After terminating the procedure, a small amount of tissue was seen in the specimen container. This small amount of tissue will be sent to the lab. No calcifications were seen in this specimen. Lesion quadrant: Upper outer quadrant Clip was not placed.  Calcifications remain at the biopsy site. IMPRESSION: 1. Unsuccessful stereotactic-guided biopsy for indeterminate calcifications in the upper-outer quadrant of the LEFT breast due to equipment failure. 2. After procedure was terminated, a small amount of tissue was seen in the specimen container. This small amount of tissue will be sent to the lab. No calcifications were seen within the specimen. 3. If pathology results for the stereotactic biopsy only reveal benign breast tissue and no microcalcifications in the specimen, patient will not be charged for today's exam. 4. Clip was not placed.  Calcifications remain at the biopsy site. 5. If the ultrasound-guided biopsy performed earlier today reveals a pathology result that is positive for malignancy, and breast conservation surgery is deemed appropriate, recommend localization of both the mass and the adjacent calcifications for the lumpectomy, or patient can be rescheduled for another attempt at the stereotactic biopsy. Electronically Signed   By: Franki Cabot M.D.   On: 10/05/2020 12:13   [place] LT BREAST BX W LOC DEV 1ST LESION IMG BX SPEC US GUIDE  Addendum Date: 10/09/2020   ADDENDUM REPORT: 10/09/2020 08:58 ADDENDUM: 1- Ultrasound Biopsy - pathology revealed GRADE II INVASIVE MAMMARY 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 retroareolar. This was found to be concordant by Dr. Franki Cabot. 2- Stereotactic Biopsy - pathology revealed MICROSCOPIC FOCUS OF IN-SITU AND INVASIVE MAMMARY CARCINOMA of the LEFT breast, upper outer  quadrant. This was found to be concordant by Dr. Franki Cabot. SEE BELOW FOR DETAILS. Due to equipment failure during the stereotactic biopsy, no calcifications were seen in second specimen, so no clip was placed for the second biopsy site. However, after removing the biopsy device from the breast, there was scant tissue seen so this scant tissue was sent to lab for histology. This tissue was likely obtained between the calcifications and the retroareolar mass. Patient has been rescheduled for stereotactic biopsy of the calcifications in the upper outer quadrant of the left breast despite the positive result of microscopic focus of in-site and invasive mammary carcinoma in order to ensure adequate defining of the extent of disease. as, again, this tissue was likely obtained between the calcifications and the retroareolar mass. Pathology results were discussed with the patient by telephone. The patient reported doing well after the biopsies with tenderness at the sites. Post biopsy instructions and care were reviewed and questions were answered. The patient was encouraged to  call [facility] for any additional concerns. The patient was instructed to return for repeat LEFT breast stereotactic guided biopsy on October 09, 2020 $[id number]$  at [facility]. Surgical consultation has been arranged with Dr. Erroll Luna at [facility] on October 16, 2020. Pathology results reported by Stacie Acres RN on 10/09/2020. Electronically Signed   By: Franki Cabot M.D.   On: 10/09/2020 08:58   Result Date: 10/09/2020 CLINICAL DATA:  Patient with an irregular mass in the retroareolar LEFT breast presents today for ultrasound-guided core biopsy. Patient also with indeterminate calcifications in the outer LEFT breast for which a stereotactic biopsy will be performed later today. EXAM: ULTRASOUND GUIDED LEFT BREAST CORE NEEDLE BIOPSY COMPARISON:  Previous exam(s). PROCEDURE: I  met with the patient and we discussed the procedure of ultrasound-guided biopsy, including benefits and alternatives. We discussed the high likelihood of a successful procedure. We discussed the risks of the procedure, including infection, bleeding, tissue injury, clip migration, and inadequate sampling. Informed written consent was given. The usual time-out protocol was performed immediately prior to the procedure. Lesion quadrant: Retroareolar Using sterile technique and 1% Lidocaine as local anesthetic, under direct ultrasound visualization, a 12 gauge spring-loaded device was used to perform biopsy of the mass in the retroareolar LEFT breast, 3 o'clock axis, using a lateral approach. At the conclusion of the procedure ribbon shaped tissue marker clip was deployed into the biopsy cavity. Follow up 2 view mammogram was performed and dictated separately. IMPRESSION: Ultrasound guided biopsy of the mass in the retroareolar LEFT breast, 3 o'clock axis. No apparent complications. Electronically Signed: By: Franki Cabot M.D. On: 10/05/2020 10:53     ELIGIBLE FOR AVAILABLE RESEARCH PROTOCOL: no  ASSESSMENT: 80 y.o. [place] woman  (1) status post right mastectomy and sentinel lymph node sampling 2004 for aT2N0, triple negative invasive ductal carcinoma.  (a) status post C cyclophosphamide and doxorubicin every 21 days x 4  (2) status post left breast central biopsy 10/05/2020 for a clinical T2N0, stage IIB invasive lobular carcinoma, E-cadherin negative, functionally triple negative, with an MIB-1 of 2%  (a) a second area  With calcifications biopsied 10/05/2020 was read as a microscopic focus of invasive mammary carcinoma, discordant  (b) repeat biopsy of the second area of concern 10/10/2019 showed pleomorphic invasive lobular carcinoma.  PLAN: I met today with Jyasia to review her new diagnosis. Specifically we discussed the biology of her breast cancer, its diagnosis, staging, treatment  options  and prognosis. Given her prior history of breast cancer and its treatment she already has a good understanding of cancer in general and the fact that if breast cancer spreads to bone or liver the patient does not have bone cancer or liver cancer but pieces of breast growing in the bone or liver--1 cancer in many places not many different cancers  We discussed the difference between local and systemic therapy. In terms of loco-regional treatment, lumpectomy plus radiation is equivalent to mastectomy as far as survival is concerned.  However in her case given the extent of the tumor and the small size of the breast as well as the fact that we are dealing with a lobular breast cancer mastectomy is recommended.  We will check lymph nodes as that might affect the radiation decision.  We then discussed the rationale for systemic therapy. There is some risk that this cancer may have already spread to other parts of her body. Patients frequently ask at this point  about bone scans, CAT scans and PET scans to find out if they have occult breast cancer somewhere else. The problem is that in early stage disease and particularly with lobular breast cancers we are much more likely to find false positives then true evidence of cancer and this would expose the patient to unnecessary procedures as well as unnecessary radiation. Scans cannot answer the question the patient really would like to know, which is whether she has microscopic disease elsewhere in her body. For those reasons we do not recommend them.  Of course we would proceed to aggressive evaluation of any symptoms that might suggest metastatic disease, but that is not the case here.  Next we went over the options for systemic therapy which are anti-estrogens, anti-HER-2 immunotherapy, and chemotherapy. Donnae does not meet criteria for anti-HER-2 immunotherapy or anti-estrogens: Her tumor is only 2% and weakly positive for estrogen and we will repeat that in the  final pathology.  At this level the benefit of antiestrogens is likely to be minimal or absent.  This means that her choice for systemic therapy is chemotherapy or no systemic therapy.  We discussed CMF chemotherapy in detail and she has a good understanding of the possible toxicities side effects and complications of this regimen, which is generally well-tolerated even in elderly patients.  However the fact that we are dealing with a lobular breast cancer which has a proliferation fraction of 2% makes significant benefit from chemotherapy unlikely.  She started today's meeting by saying she wanted to avoid chemotherapy if possible and given the choice of CMF she is very clear that she would opt for observation alone understanding that if we do find lobular breast cancer outside the breast it would be stage IV and not curable at that time.  She was apprised that I mentioned radiation.  We discussed the role of radiation in general and specifically in her case.  I think a lot will depend on what we find at the final surgery, with her margins are close and with her lymph nodes are positive.  For that reason I would suggest postponing the radiation visit until after the surgery.  She is agreeable to that.  I will see Teresita sometime in June.  By that time she will be done with surgery and if she has radiation will also be done with that.  I am going to request a molecular study from the final pathology.  Those usually take a few weeks for results so when she sees me again in June we should be able to make a decision whether there is any systemic treatment we can offer that is not chemotherapy and whether she would like to continue observation here or simply through her surgeon.  Total encounter time 65 minutes.Sarajane Jews C. Payslie Mccaig, MD 10/30/2020 5:44 PM Medical Oncology and Hematology [facility] [place], [place] [address] Tel. [phone]    Fax. [phone]   This  document serves as a record of services personally performed by Lurline Del, MD. It was created on his behalf by Wilburn Mylar, a trained medical scribe. The creation of this record is based on the scribe's personal observations and the provider's statements to them.   I, Lurline Del MD, have reviewed the above documentation for accuracy and completeness, and I agree with the above.    *Total Encounter Time as defined by the Centers for Medicare and Medicaid Services includes, in addition to the face-to-face time of a  patient visit (documented in the note above) non-face-to-face time: obtaining and reviewing outside history, ordering and reviewing medications, tests or procedures, care coordination (communications with other health care professionals or caregivers) and documentation in the medical record.

## 2020-10-30 ENCOUNTER — Inpatient Hospital Stay: Payer: Medicare Other | Attending: Oncology | Admitting: Oncology

## 2020-10-30 ENCOUNTER — Inpatient Hospital Stay: Payer: Medicare Other

## 2020-10-30 ENCOUNTER — Other Ambulatory Visit: Payer: Self-pay

## 2020-10-30 VITALS — BP 158/90 | HR 114 | Temp 97.4°F | Resp 18 | Ht 66.75 in | Wt 141.3 lb

## 2020-10-30 DIAGNOSIS — Z9011 Acquired absence of right breast and nipple: Secondary | ICD-10-CM | POA: Diagnosis not present

## 2020-10-30 DIAGNOSIS — C50112 Malignant neoplasm of central portion of left female breast: Secondary | ICD-10-CM | POA: Diagnosis not present

## 2020-10-30 DIAGNOSIS — Z853 Personal history of malignant neoplasm of breast: Secondary | ICD-10-CM | POA: Diagnosis not present

## 2020-10-30 DIAGNOSIS — Z171 Estrogen receptor negative status [ER-]: Secondary | ICD-10-CM | POA: Diagnosis not present

## 2020-10-30 LAB — CMP (CANCER CENTER ONLY)
ALT: 15 U/L (ref 0–44)
AST: 20 U/L (ref 15–41)
Albumin: 4.4 g/dL (ref 3.5–5.0)
Alkaline Phosphatase: 76 U/L (ref 38–126)
Anion gap: 11 (ref 5–15)
BUN: 14 mg/dL (ref 8–23)
CO2: 28 mmol/L (ref 22–32)
Calcium: 9.4 mg/dL (ref 8.9–10.3)
Chloride: 102 mmol/L (ref 98–111)
Creatinine: 0.74 mg/dL (ref 0.44–1.00)
GFR, Estimated: >60 mL/min (ref >60)
Glucose, Bld: 117 mg/dL — ABNORMAL HIGH (ref 70–99)
Potassium: 4.1 mmol/L (ref 3.5–5.1)
Sodium: 141 mmol/L (ref 135–145)
Total Bilirubin: 0.5 mg/dL (ref 0.3–1.2)
Total Protein: 7.5 g/dL (ref 6.5–8.1)

## 2020-10-30 LAB — CBC WITH DIFFERENTIAL (CANCER CENTER ONLY)
Abs Immature Granulocytes: 0.01 10*3/uL (ref 0.00–0.07)
Basophils Absolute: 0.1 10*3/uL (ref 0.0–0.1)
Basophils Relative: 1 %
Eosinophils Absolute: 0 10*3/uL (ref 0.0–0.5)
Eosinophils Relative: 1 %
HCT: 38.8 % (ref 36.0–46.0)
Hemoglobin: 12.9 g/dL (ref 12.0–15.0)
Immature Granulocytes: 0 %
Lymphocytes Relative: 32 %
Lymphs Abs: 1.5 10*3/uL (ref 0.7–4.0)
MCH: 30.8 pg (ref 26.0–34.0)
MCHC: 33.2 g/dL (ref 30.0–36.0)
MCV: 92.6 fL (ref 80.0–100.0)
Monocytes Absolute: 0.4 10*3/uL (ref 0.1–1.0)
Monocytes Relative: 8 %
Neutro Abs: 2.8 10*3/uL (ref 1.7–7.7)
Neutrophils Relative %: 58 %
Platelet Count: 214 10*3/uL (ref 150–400)
RBC: 4.19 MIL/uL (ref 3.87–5.11)
RDW: 13.1 % (ref 11.5–15.5)
WBC Count: 4.9 10*3/uL (ref 4.0–10.5)
nRBC: 0 % (ref 0.0–0.2)

## 2020-10-31 ENCOUNTER — Ambulatory Visit: Payer: Self-pay | Admitting: Surgery

## 2020-10-31 DIAGNOSIS — Z170 Estrogen receptor positive status [ER+]: Secondary | ICD-10-CM

## 2020-10-31 DIAGNOSIS — C50912 Malignant neoplasm of unspecified site of left female breast: Secondary | ICD-10-CM

## 2020-11-01 ENCOUNTER — Telehealth: Payer: Self-pay | Admitting: Oncology

## 2020-11-01 ENCOUNTER — Encounter: Payer: Self-pay | Admitting: *Deleted

## 2020-11-01 NOTE — Telephone Encounter (Signed)
Scheduled per 3/29 los. Called pt no answer and unable to leave a msg. Mailing appt letter and calendar printout

## 2020-11-06 ENCOUNTER — Telehealth: Payer: Self-pay | Admitting: Licensed Clinical Social Worker

## 2020-11-06 NOTE — Telephone Encounter (Signed)
[employer] Clinical Social Work  CSW called to introduce self and support services to patient. Patient reports doing fairly well right now and has no practical resource needs. She is interested in seeing more about the support programming, CSW mailed monthly support calendar today. No follow-up needed at this time.   Christeen Douglas, LCSW

## 2020-11-09 ENCOUNTER — Other Ambulatory Visit (HOSPITAL_COMMUNITY)
Admission: RE | Admit: 2020-11-09 | Discharge: 2020-11-09 | Disposition: A | Discharge status: Home / Self Care | Payer: Medicare Other | Source: Ambulatory Visit | Attending: Surgery | Admitting: Surgery

## 2020-11-09 DIAGNOSIS — Z20822 Contact with and (suspected) exposure to covid-19: Secondary | ICD-10-CM | POA: Diagnosis not present

## 2020-11-09 DIAGNOSIS — Z01812 Encounter for preprocedural laboratory examination: Secondary | ICD-10-CM | POA: Diagnosis present

## 2020-11-10 LAB — SARS CORONAVIRUS 2 (TAT 6-24 HRS): SARS Coronavirus 2: NEGATIVE

## 2020-11-11 ENCOUNTER — Encounter (HOSPITAL_COMMUNITY): Payer: Self-pay | Admitting: Surgery

## 2020-11-11 NOTE — Progress Notes (Signed)
Pre-op instructions given to pt. Denies chest pain/sob. Instructed no solid food after midnight, clear liquids until 0700. Continue to quarantine.

## 2020-11-12 NOTE — Progress Notes (Signed)
Pt made aware of surgery time change of [phone], pt to arrive at 0530. Pt also instructed to stop drinking clear liquids at 0430.

## 2020-11-13 ENCOUNTER — Other Ambulatory Visit: Payer: Self-pay

## 2020-11-13 ENCOUNTER — Ambulatory Visit (HOSPITAL_COMMUNITY)
Admission: RE | Admit: 2020-11-13 | Discharge: 2020-11-13 | Disposition: A | Discharge status: Home / Self Care | Payer: Medicare Other | Source: Ambulatory Visit | Attending: Surgery | Admitting: Surgery

## 2020-11-13 ENCOUNTER — Encounter (HOSPITAL_COMMUNITY): Payer: Self-pay | Admitting: Surgery

## 2020-11-13 ENCOUNTER — Encounter (HOSPITAL_COMMUNITY)
Admission: RE | Disposition: A | Discharge status: Home / Self Care | Payer: Self-pay | Source: Home / Self Care | Attending: Surgery

## 2020-11-13 ENCOUNTER — Ambulatory Visit (HOSPITAL_COMMUNITY): Payer: Medicare Other | Admitting: Anesthesiology

## 2020-11-13 ENCOUNTER — Observation Stay (HOSPITAL_COMMUNITY)
Admission: RE | Admit: 2020-11-13 | Discharge: 2020-11-14 | Disposition: A | Discharge status: Home / Self Care | Payer: Medicare Other | Attending: Surgery | Admitting: Surgery

## 2020-11-13 DIAGNOSIS — C50912 Malignant neoplasm of unspecified site of left female breast: Secondary | ICD-10-CM | POA: Diagnosis present

## 2020-11-13 DIAGNOSIS — C50412 Malignant neoplasm of upper-outer quadrant of left female breast: Secondary | ICD-10-CM | POA: Diagnosis not present

## 2020-11-13 DIAGNOSIS — Z79899 Other long term (current) drug therapy: Secondary | ICD-10-CM | POA: Insufficient documentation

## 2020-11-13 HISTORY — PX: SIMPLE MASTECTOMY WITH AXILLARY SENTINEL NODE BIOPSY: SHX6098

## 2020-11-13 HISTORY — DX: Unspecified osteoarthritis, unspecified site: M19.90

## 2020-11-13 HISTORY — PX: SENTINEL NODE BIOPSY: SHX6608

## 2020-11-13 SURGERY — SIMPLE MASTECTOMY
Anesthesia: General | Site: Breast | Laterality: Left

## 2020-11-13 MED ORDER — TECHNETIUM TC 99M TILMANOCEPT KIT
1.0000 | PACK | Freq: Once | INTRAVENOUS | Status: AC | PRN
  Administered 2020-11-13: 1 via INTRADERMAL

## 2020-11-13 MED ORDER — OXYCODONE-ACETAMINOPHEN 5-325 MG PO TABS
ORAL_TABLET | ORAL | Status: AC
  Filled 2020-11-13: qty 2

## 2020-11-13 MED ORDER — PROPOFOL 10 MG/ML IV BOLUS
INTRAVENOUS | Status: DC | PRN
  Administered 2020-11-13: 140 mg via INTRAVENOUS

## 2020-11-13 MED ORDER — METOPROLOL TARTRATE 5 MG/5ML IV SOLN
INTRAVENOUS | Status: DC | PRN
  Administered 2020-11-13: 2.5 mg via INTRAVENOUS

## 2020-11-13 MED ORDER — ONDANSETRON HCL 4 MG/2ML IJ SOLN: 4.0000 mg | Freq: Four times a day (QID) | INTRAMUSCULAR | Status: DC | PRN

## 2020-11-13 MED ORDER — METHYLENE BLUE 0.5 % INJ SOLN
INTRAVENOUS | Status: AC
  Filled 2020-11-13: qty 10

## 2020-11-13 MED ORDER — ONDANSETRON HCL 4 MG/2ML IJ SOLN: 4.0000 mg | Freq: Once | INTRAMUSCULAR | Status: DC | PRN

## 2020-11-13 MED ORDER — METHOCARBAMOL 500 MG PO TABS: 500.0000 mg | ORAL_TABLET | Freq: Four times a day (QID) | ORAL | Status: DC | PRN

## 2020-11-13 MED ORDER — LIDOCAINE 2% (20 MG/ML) 5 ML SYRINGE
INTRAMUSCULAR | Status: DC | PRN
  Administered 2020-11-13: 80 mg via INTRAVENOUS

## 2020-11-13 MED ORDER — HYDRALAZINE HCL 10 MG PO TABS: 10.0000 mg | ORAL_TABLET | Freq: Four times a day (QID) | ORAL | Status: DC | PRN

## 2020-11-13 MED ORDER — CHLORHEXIDINE GLUCONATE 0.12 % MT SOLN
OROMUCOSAL | Status: AC
  Administered 2020-11-13: 15 mL
  Filled 2020-11-13: qty 15

## 2020-11-13 MED ORDER — POLYETHYL GLYCOL-PROPYL GLYCOL 0.4-0.3 % OP GEL: Freq: Every day | OPHTHALMIC | Status: DC | PRN

## 2020-11-13 MED ORDER — PROPOFOL 10 MG/ML IV BOLUS
INTRAVENOUS | Status: AC
  Filled 2020-11-13: qty 40

## 2020-11-13 MED ORDER — ACETAMINOPHEN 500 MG PO TABS
1000.0000 mg | ORAL_TABLET | Freq: Four times a day (QID) | ORAL | Status: DC
  Administered 2020-11-13 – 2020-11-14 (×4): 1000 mg via ORAL
  Filled 2020-11-13 (×4): qty 2

## 2020-11-13 MED ORDER — 0.9 % SODIUM CHLORIDE (POUR BTL) OPTIME
TOPICAL | Status: DC | PRN
  Administered 2020-11-13: 1000 mL

## 2020-11-13 MED ORDER — DEXTROSE-NACL 5-0.9 % IV SOLN: INTRAVENOUS | Status: DC

## 2020-11-13 MED ORDER — METOPROLOL TARTRATE 5 MG/5ML IV SOLN
INTRAVENOUS | Status: AC
  Filled 2020-11-13: qty 5

## 2020-11-13 MED ORDER — CEFAZOLIN SODIUM-DEXTROSE 2-4 GM/100ML-% IV SOLN
2.0000 g | INTRAVENOUS | Status: AC
  Administered 2020-11-13: 2 g via INTRAVENOUS
  Filled 2020-11-13: qty 100

## 2020-11-13 MED ORDER — CHLORHEXIDINE GLUCONATE CLOTH 2 % EX PADS: 6.0000 | MEDICATED_PAD | Freq: Once | CUTANEOUS | Status: DC

## 2020-11-13 MED ORDER — ONDANSETRON 4 MG PO TBDP
4.0000 mg | ORAL_TABLET | Freq: Four times a day (QID) | ORAL | Status: DC | PRN
Start: 2020-11-13 — End: 2020-11-14

## 2020-11-13 MED ORDER — ATENOLOL 25 MG PO TABS: 12.5000 mg | ORAL_TABLET | Freq: Two times a day (BID) | ORAL | Status: DC

## 2020-11-13 MED ORDER — LACTATED RINGERS IV SOLN: INTRAVENOUS | Status: DC | PRN

## 2020-11-13 MED ORDER — SODIUM CHLORIDE (PF) 0.9 % IJ SOLN
INTRAMUSCULAR | Status: AC
  Filled 2020-11-13: qty 10

## 2020-11-13 MED ORDER — VANCOMYCIN HCL 500 MG IV SOLR
INTRAVENOUS | Status: DC | PRN
  Administered 2020-11-13: 500 mg via TOPICAL

## 2020-11-13 MED ORDER — ENOXAPARIN SODIUM 40 MG/0.4ML ~~LOC~~ SOLN: 40.0000 mg | SUBCUTANEOUS | Status: DC

## 2020-11-13 MED ORDER — DEXAMETHASONE SODIUM PHOSPHATE 10 MG/ML IJ SOLN
INTRAMUSCULAR | Status: DC | PRN
  Administered 2020-11-13: 10 mg via INTRAVENOUS

## 2020-11-13 MED ORDER — FENTANYL CITRATE (PF) 100 MCG/2ML IJ SOLN
INTRAMUSCULAR | Status: AC
  Filled 2020-11-13: qty 2

## 2020-11-13 MED ORDER — FENTANYL CITRATE (PF) 100 MCG/2ML IJ SOLN: 25.0000 ug | INTRAMUSCULAR | Status: DC | PRN

## 2020-11-13 MED ORDER — FENTANYL CITRATE (PF) 250 MCG/5ML IJ SOLN
INTRAMUSCULAR | Status: DC | PRN
  Administered 2020-11-13 (×5): 50 ug via INTRAVENOUS

## 2020-11-13 MED ORDER — ROCURONIUM BROMIDE 10 MG/ML (PF) SYRINGE
PREFILLED_SYRINGE | INTRAVENOUS | Status: DC | PRN
  Administered 2020-11-13: 60 mg via INTRAVENOUS

## 2020-11-13 MED ORDER — PHENYLEPHRINE HCL-NACL 10-0.9 MG/250ML-% IV SOLN
INTRAVENOUS | Status: DC | PRN
  Administered 2020-11-13: 30 ug/min via INTRAVENOUS

## 2020-11-13 MED ORDER — FENTANYL CITRATE (PF) 100 MCG/2ML IJ SOLN: 12.5000 ug | INTRAMUSCULAR | Status: DC | PRN

## 2020-11-13 MED ORDER — HEMOSTATIC AGENTS (NO CHARGE) OPTIME
TOPICAL | Status: DC | PRN
  Administered 2020-11-13: 1 via TOPICAL

## 2020-11-13 MED ORDER — VANCOMYCIN HCL 500 MG IV SOLR
INTRAVENOUS | Status: AC
  Filled 2020-11-13: qty 500

## 2020-11-13 MED ORDER — ONDANSETRON HCL 4 MG/2ML IJ SOLN
INTRAMUSCULAR | Status: DC | PRN
  Administered 2020-11-13: 4 mg via INTRAVENOUS

## 2020-11-13 MED ORDER — FENTANYL CITRATE (PF) 250 MCG/5ML IJ SOLN
INTRAMUSCULAR | Status: AC
  Filled 2020-11-13: qty 5

## 2020-11-13 MED ORDER — BUPIVACAINE-EPINEPHRINE (PF) 0.5% -1:200000 IJ SOLN
INTRAMUSCULAR | Status: DC | PRN
  Administered 2020-11-13: 30 mL

## 2020-11-13 MED ORDER — DIPHENHYDRAMINE HCL 12.5 MG/5ML PO ELIX: 12.5000 mg | ORAL_SOLUTION | Freq: Four times a day (QID) | ORAL | Status: DC | PRN

## 2020-11-13 MED ORDER — SODIUM CHLORIDE 0.9 % IV SOLN
INTRAVENOUS | Status: AC
  Administered 2020-11-13: 500 mL
  Filled 2020-11-13: qty 1000

## 2020-11-13 MED ORDER — DIPHENHYDRAMINE HCL 50 MG/ML IJ SOLN: 12.5000 mg | Freq: Four times a day (QID) | INTRAMUSCULAR | Status: DC | PRN

## 2020-11-13 MED ORDER — SUGAMMADEX SODIUM 200 MG/2ML IV SOLN
INTRAVENOUS | Status: DC | PRN
  Administered 2020-11-13: 200 mg via INTRAVENOUS

## 2020-11-13 MED ORDER — OXYCODONE-ACETAMINOPHEN 5-325 MG PO TABS
1.0000 | ORAL_TABLET | ORAL | Status: DC | PRN
  Administered 2020-11-13: 1 via ORAL

## 2020-11-13 SURGICAL SUPPLY — 40 items
APPLIER CLIP 9.375 MED OPEN (MISCELLANEOUS) ×3
BINDER BREAST LRG (GAUZE/BANDAGES/DRESSINGS) ×3 IMPLANT
BIOPATCH RED 1 DISK 7.0 (GAUZE/BANDAGES/DRESSINGS) ×2 IMPLANT
BIOPATCH RED 1IN DISK 7.0MM (GAUZE/BANDAGES/DRESSINGS) ×1
CANISTER SUCT 3000ML PPV (MISCELLANEOUS) ×3 IMPLANT
CHLORAPREP W/TINT 26 (MISCELLANEOUS) ×3 IMPLANT
CLIP APPLIE 9.375 MED OPEN (MISCELLANEOUS) ×1 IMPLANT
COVER PROBE W GEL 5X96 (DRAPES) ×3 IMPLANT
COVER SURGICAL LIGHT HANDLE (MISCELLANEOUS) ×3 IMPLANT
COVER WAND RF STERILE (DRAPES) IMPLANT
DERMABOND ADVANCED (GAUZE/BANDAGES/DRESSINGS) ×2
DERMABOND ADVANCED .7 DNX12 (GAUZE/BANDAGES/DRESSINGS) ×1 IMPLANT
DRAIN CHANNEL 19F RND (DRAIN) ×3 IMPLANT
DRAPE LAPAROSCOPIC ABDOMINAL (DRAPES) ×3 IMPLANT
DRSG PAD ABDOMINAL 8X10 ST (GAUZE/BANDAGES/DRESSINGS) ×3 IMPLANT
DRSG TEGADERM 2-3/8X2-3/4 SM (GAUZE/BANDAGES/DRESSINGS) ×3 IMPLANT
ELECT REM PT RETURN 9FT ADLT (ELECTROSURGICAL) ×3
ELECTRODE REM PT RTRN 9FT ADLT (ELECTROSURGICAL) ×1 IMPLANT
EVACUATOR SILICONE 100CC (DRAIN) ×3 IMPLANT
GAUZE SPONGE 4X4 12PLY STRL (GAUZE/BANDAGES/DRESSINGS) ×3 IMPLANT
GLOVE BIO SURGEON STRL SZ8 (GLOVE) ×3 IMPLANT
GLOVE SRG 8 PF TXTR STRL LF DI (GLOVE) ×1 IMPLANT
GLOVE SURG UNDER POLY LF SZ8 (GLOVE) ×2
GOWN STRL REUS W/ TWL LRG LVL3 (GOWN DISPOSABLE) ×2 IMPLANT
GOWN STRL REUS W/ TWL XL LVL3 (GOWN DISPOSABLE) ×1 IMPLANT
GOWN STRL REUS W/TWL LRG LVL3 (GOWN DISPOSABLE) ×4
GOWN STRL REUS W/TWL XL LVL3 (GOWN DISPOSABLE) ×2
HEMOSTAT ARISTA ABSORB 3G PWDR (HEMOSTASIS) ×9 IMPLANT
KIT BASIN OR (CUSTOM PROCEDURE TRAY) ×3 IMPLANT
KIT TURNOVER KIT B (KITS) ×3 IMPLANT
NS IRRIG 1000ML POUR BTL (IV SOLUTION) ×3 IMPLANT
PACK GENERAL/GYN (CUSTOM PROCEDURE TRAY) ×3 IMPLANT
PAD ARMBOARD 7.5X6 YLW CONV (MISCELLANEOUS) ×3 IMPLANT
PENCIL SMOKE EVACUATOR (MISCELLANEOUS) ×3 IMPLANT
SPECIMEN JAR X LARGE (MISCELLANEOUS) ×3 IMPLANT
SUT ETHILON 3 0 FSL (SUTURE) ×3 IMPLANT
SUT MNCRL AB 4-0 PS2 18 (SUTURE) ×3 IMPLANT
SUT VIC AB 3-0 SH 18 (SUTURE) ×3 IMPLANT
TOWEL GREEN STERILE (TOWEL DISPOSABLE) ×3 IMPLANT
TOWEL GREEN STERILE FF (TOWEL DISPOSABLE) ×3 IMPLANT

## 2020-11-13 NOTE — Op Note (Signed)
Preop diagnosis: Left breast cancer upper outer quadrant  Postop diagnosis: Same  Procedure: Left simple mastectomy with sentinel lymph node mapping   Surgeon: Erroll Luna M.D.  Anesthesia: LMA with pectoral block  Asst.: OR staff  E.B.L: 60 cc  Drains: 19 French round  IV fluids: Per anesthesia record  Specimen: Left breast with 3 left axillary sentinel nodes  Indications for procedure: The patient is a 80 year old female with a history of breast cancer diagnosed by core biopsy. Options of breast conservation surgery and mastectomy were discussed. Risks, benefits, complications and other treatment possibilities were discussed. Reconstruction options were discussed.The surgical and non surgical options have been discussed with the patient.  Risks of surgery include bleeding,  Infection,  Flap necrosis,  Tissue loss,  Chronic pain, death, Numbness,  And the need for additional procedures.  Reconstruction options also have been discussed with the patient as well.  The patient agrees to proceed.   Description of procedure: The patient was seen in the holding area in the left breast was marked. The patient underwent injection by nuclear medicine. All questions were answered.  Pectoral block placed per anesthesia. the patient agreed to proceed. The patient was taken to the operating room. The patient was placed supine. After induction of general anesthesia,  the left chest was prepped and draped in a sterile fashion. Timeout was done. She received antibiotic prophylaxis. Two curved linear incisions were made above and below the nipple areolar complex. Superior and inferior skin flaps were raised to the clavicle and inferior mammary fold. The breast was then dissected off the chest wall taking the pectoralis fascia with the specimen in a medial to lateral fashion. Upon entering the axilla, the neoprobe was used to identify the sentinel nodes.  There were 3 sentinel lymph nodes that were hot .  They were removed. Background counts approached 0 and were less than 10% of the injection site counts. The specimens were passed off the field.  Long thoracic nerve, thoracodorsal trunk and axillary vein were all preserved.  Antibiotic irrigation used.  Hemostasis achieved with cautery and [place]ista.  Vancomycin powder applied.  After ensuring hemostasis, one 19 [employer] drains were placed through a separate stab incision to the inferior flap. It was  secured to the skin with 3-0 nylon suture. . The wound was closed with a deep layer of 3-0 Vicryl interrupted sutures. 3-0 Monocryl was used to close the skin a subcuticular fashion. The drain was placed to suction with good seal. Dermabond was applied to the  incision. All final counts sponge, instruments and needles were correct.  Breast binder placed.  The patient was awoke and taken to PACU stable condition.

## 2020-11-13 NOTE — Progress Notes (Signed)
Dr. Gifford Shave aware of elevated BP and HR.  No new orders.

## 2020-11-13 NOTE — Anesthesia Procedure Notes (Signed)
Anesthesia Regional Block: Pectoralis block   Pre-Anesthetic Checklist: ,, timeout performed, Correct Patient, Correct Site, Correct Laterality, Correct Procedure, Correct Position, site marked, Risks and benefits discussed,  Surgical consent,  Pre-op evaluation,  At surgeon's request and post-op pain management  Laterality: Left  Prep: chloraprep       Needles:  Injection technique: Single-shot  Needle Type: Echogenic Needle     Needle Length: 9cm  Needle Gauge: 21     Additional Needles:   Procedures:,,,, ultrasound used (permanent image in chart),,,,  Narrative:  Start time: 11/13/2020 6:54 AM End time: 11/13/2020 7:04 AM Injection made incrementally with aspirations every 5 mL.  Performed by: Personally  Anesthesiologist: Catalina Gravel, MD  Additional Notes: No pain on injection. No increased resistance to injection. Injection made in 5cc increments.  Good needle visualization.  Patient tolerated procedure well.

## 2020-11-13 NOTE — Transfer of Care (Signed)
Immediate Anesthesia Transfer of Care Note  Patient: Evanell Redlich  Procedure(s) Performed: LEFT SIMPLE MASTECTOMY (Left Breast) SENTINEL NODE BIOPSY (Left Breast)  Patient Location: PACU  Anesthesia Type:General  Level of Consciousness: awake, alert  and oriented  Airway & Oxygen Therapy: Patient Spontanous Breathing  Post-op Assessment: Report given to RN and Post -op Vital signs reviewed and stable  Post vital signs: Reviewed  Last Vitals:  Vitals Value Taken Time  BP 144/76 11/13/20 0910  Temp    Pulse 82 11/13/20 0914  Resp 9 11/13/20 0914  SpO2 98 % 11/13/20 0914  Vitals shown include unvalidated device data.  Last Pain:  Vitals:   11/13/20 0555  TempSrc:   PainSc: 0-No pain         Complications: No complications documented.

## 2020-11-13 NOTE — Anesthesia Preprocedure Evaluation (Addendum)
Anesthesia Evaluation  Patient identified by MRN, date of birth, ID band Patient awake    Reviewed: Allergy & Precautions, NPO status , Patient's Chart, lab work & pertinent test results  History of Anesthesia Complications Negative for: history of anesthetic complications  Airway Mallampati: II  TM Distance: >3 FB Neck ROM: Full    Dental  (+) Teeth Intact, Dental Advisory Given   Pulmonary neg pulmonary ROS,    Pulmonary exam normal breath sounds clear to auscultation       Cardiovascular hypertension, Normal cardiovascular exam Rhythm:Regular Rate:Normal     Neuro/Psych negative neurological ROS  negative psych ROS   GI/Hepatic negative GI ROS, Neg liver ROS,   Endo/Other  negative endocrine ROS  Renal/GU negative Renal ROS     Musculoskeletal  (+) Arthritis ,   Abdominal   Peds  Hematology negative hematology ROS (+)   Anesthesia Other Findings Day of surgery medications reviewed with the patient.  LEFT BREAST CANCER  Reproductive/Obstetrics                            Anesthesia Physical Anesthesia Plan  ASA: III  Anesthesia Plan: General   Post-op Pain Management:    Induction: Intravenous  PONV Risk Score and Plan: 3 and Dexamethasone and Ondansetron  Airway Management Planned: Oral ETT and Video Laryngoscope Planned  Additional Equipment:   Intra-op Plan:   Post-operative Plan: Extubation in OR  Informed Consent: I have reviewed the patients History and Physical, chart, labs and discussed the procedure including the risks, benefits and alternatives for the proposed anesthesia with the patient or authorized representative who has indicated his/her understanding and acceptance.     Dental advisory given  Plan Discussed with: CRNA  Anesthesia Plan Comments:        Anesthesia Quick Evaluation

## 2020-11-13 NOTE — Progress Notes (Signed)
Patient transported to 6N11, VS stable, pain tolerable. Notified daughter of room assignment. No questions or concerns from receiving RN.  Rowe Pavy, RN

## 2020-11-13 NOTE — Interval H&P Note (Signed)
History and Physical Interval Note:  11/13/2020 7:22 AM  Lindsay Leach  has presented today for surgery, with the diagnosis of LEFT BREAST CANCER.  The various methods of treatment have been discussed with the patient and family. After consideration of risks, benefits and other options for treatment, the patient has consented to  Procedure(s): LEFT SIMPLE MASTECTOMY (Left) SENTINEL NODE BIOPSY (Left) as a surgical intervention.  The patient's history has been reviewed, patient examined, no change in status, stable for surgery.  I have reviewed the patient's chart and labs.  Questions were answered to the patient's satisfaction.  The surgical and non surgical options have been discussed with the patient.  Risks of surgery include bleeding,  Infection,  Flap necrosis,  Tissue loss,  Chronic pain, death, Numbness,  And the need for additional procedures.  Reconstruction options also have been discussed with the patient as well.  The patient agrees to proceed.    [place]

## 2020-11-13 NOTE — Discharge Instructions (Signed)
[facility] [place] °[phone] ° °MASTECTOMY: POST OP INSTRUCTIONS ° °Always review your discharge instruction sheet given to you by the facility where your surgery was performed. °IF YOU HAVE DISABILITY OR FAMILY LEAVE FORMS, YOU MUST BRING THEM TO THE OFFICE FOR PROCESSING.   °DO NOT GIVE THEM TO YOUR DOCTOR. °A prescription for pain medication may be given to you upon discharge.  Take your pain medication as prescribed, if needed.  If narcotic pain medicine is not needed, then you may take acetaminophen (Tylenol) or ibuprofen (Advil) as needed. °1. Take your usually prescribed medications unless otherwise directed. °2. If you need a refill on your pain medication, please contact your pharmacy.  They will contact our office to request authorization.  Prescriptions will not be filled after 5pm or on week-ends. °3. You should follow a light diet the first few days after arrival home, such as soup and crackers, etc.  Resume your normal diet the day after surgery. °4. Most patients will experience some swelling and bruising on the chest and underarm.  Ice packs will help.  Swelling and bruising can take several days to resolve.  °5. It is common to experience some constipation if taking pain medication after surgery.  Increasing fluid intake and taking a stool softener (such as Colace) will usually help or prevent this problem from occurring.  A mild laxative (Milk of Magnesia or Miralax) should be taken according to package instructions if there are no bowel movements after 48 hours. °6. Unless discharge instructions indicate otherwise, leave your bandage dry and in place until your next appointment in 3-5 days.  You may take a limited sponge bath.  No tube baths or showers until the drains are removed.  You may have steri-strips (small skin tapes) in place directly over the incision.  These strips should be left on the skin for 7-10 days.  If your surgeon used skin glue on the incision, you may  shower in 24 hours.  The glue will flake off over the next 2-3 weeks.  Any sutures or staples will be removed at the office during your follow-up visit. °7. DRAINS:  If you have drains in place, it is important to keep a list of the amount of drainage produced each day in your drains.  Before leaving the hospital, you should be instructed on drain care.  Call our office if you have any questions about your drains. °8. ACTIVITIES:  You may resume regular (light) daily activities beginning the next day--such as daily self-care, walking, climbing stairs--gradually increasing activities as tolerated.  You may have sexual intercourse when it is comfortable.  Refrain from any heavy lifting or straining until approved by your doctor. °a. You may drive when you are no longer taking prescription pain medication, you can comfortably wear a seatbelt, and you can safely maneuver your car and apply brakes. °b. RETURN TO WORK:  __________________________________________________________ °9. You should see your doctor in the office for a follow-up appointment approximately 3-5 days after your surgery.  Your doctor’s nurse will typically make your follow-up appointment when she calls you with your pathology report.  Expect your pathology report 2-3 business days after your surgery.  You may call to check if you do not hear from us after three days.   °10. OTHER INSTRUCTIONS: ______________________________________________________________________________________________ ____________________________________________________________________________________________ °WHEN TO CALL YOUR DOCTOR: °1. Fever over 101.0 °2. Nausea and/or vomiting °3. Extreme swelling or bruising °4. Continued bleeding from incision. °5. Increased pain, redness, or drainage from the incision. °  The clinic staff is available to answer your questions during regular business hours.  Please don’t hesitate to call and ask to speak to one of the nurses for clinical  concerns.  If you have a medical emergency, go to the nearest emergency room or call 911.  A surgeon from [facility] is always on call at the hospital. °[address], [address], [place], [place]  [address] ? [address] [address], [place], [place]   [address] °[phone] ? [phone] ? FAX [phone] °Web site: [email or web address] °

## 2020-11-13 NOTE — Anesthesia Postprocedure Evaluation (Signed)
Anesthesia Post Note  Patient: Lindsay Leach  Procedure(s) Performed: LEFT SIMPLE MASTECTOMY (Left Breast) SENTINEL NODE BIOPSY (Left Breast)     Patient location during evaluation: PACU Anesthesia Type: General Level of consciousness: awake and alert, awake and oriented Pain management: pain level controlled Vital Signs Assessment: post-procedure vital signs reviewed and stable Respiratory status: spontaneous breathing, nonlabored ventilation and respiratory function stable Cardiovascular status: blood pressure returned to baseline and stable Postop Assessment: no apparent nausea or vomiting Anesthetic complications: no   No complications documented.  Last Vitals:  Vitals:   11/13/20 0940 11/13/20 1011  BP: (!) 155/87 (!) 159/80  Pulse: 82 77  Resp: 16 16  Temp: 36.5 C (!) 36.4 C  SpO2: 98% 98%    Last Pain:  Vitals:   11/13/20 0940  TempSrc: Temporal  PainSc: Asleep                 Catalina Gravel

## 2020-11-13 NOTE — Anesthesia Procedure Notes (Signed)
Procedure Name: Intubation Date/Time: 11/13/2020 7:40 AM Performed by: Griffin Dakin, CRNA Pre-anesthesia Checklist: Patient identified, Emergency Drugs available, Suction available and Patient being monitored Patient Re-evaluated:Patient Re-evaluated prior to induction Oxygen Delivery Method: Circle system utilized Preoxygenation: Pre-oxygenation with 100% oxygen Induction Type: IV induction Ventilation: Mask ventilation without difficulty Laryngoscope Size: Glidescope and 3 Grade View: Grade I Tube type: Oral Tube size: 7.0 mm Number of attempts: 1 Airway Equipment and Method: Video-laryngoscopy and Rigid stylet Placement Confirmation: ETT inserted through vocal cords under direct vision,  positive ETCO2 and breath sounds checked- equal and bilateral Secured at: 24 cm Tube secured with: Tape Dental Injury: Teeth and Oropharynx as per pre-operative assessment  Comments: Electively used glide per request of patient to visualize possible cyst in airway. No cyst observed.

## 2020-11-14 ENCOUNTER — Encounter (HOSPITAL_COMMUNITY): Payer: Self-pay | Admitting: Surgery

## 2020-11-14 MED ORDER — OXYCODONE HCL 5 MG PO TABS: 5.0000 mg | ORAL_TABLET | Freq: Four times a day (QID) | ORAL | 0 refills | Status: AC | PRN

## 2020-11-14 NOTE — Progress Notes (Signed)
Reviewed Breast Cancer Bag contents and JP drain record/stripping with pt and daughter at bedside. No questions about JP drain emptying or keeping a record fo the drainage post DC. Follow up appt is on 4/25.

## 2020-11-14 NOTE — Discharge Summary (Signed)
Physician Discharge Summary  Patient ID: Lindsay Leach MRN: [id number] DOB/AGE: [birth year] 80 y.o.  Admit date: 11/13/2020 Discharge date: 11/14/2020  Admission Diagnoses:LEFT BREAST CANCER  Discharge Diagnoses: SAME Active Problems:   Breast cancer, stage 1, left [facility])   Discharged Condition: good  Hospital Course: Pt did well post op.  She had good pain control, tolerated her diet and ambulated.  No signs of bleeding and the flaps were viable .       Treatments: surgery: left simple mastectomy with SLN mapping   Discharge Exam: Blood pressure 135/61, pulse 81, temperature 97.7 F (36.5 C), resp. rate 17, height 5' 6.5" (1.689 m), weight 63.5 kg, SpO2 98 %. General appearance: alert and cooperative Breasts: normal appearance, no masses or tenderness, incisions CDI flap viable rightbreast surgically absent no hematoma  Disposition: Discharge disposition: 01-Home or Self Care       Discharge Instructions    Diet - low sodium heart healthy   Complete by: As directed    Increase activity slowly   Complete by: As directed      Allergies as of 11/14/2020      Reactions   Advil [ibuprofen] Rash   Sulfa Antibiotics Nausea Only      Medication List    TAKE these medications   acetaminophen 500 MG tablet Commonly known as: TYLENOL Take 1,000 mg by mouth every 6 (six) hours as needed for moderate pain or headache.   CENTRUM SILVER PO Take 1 tablet by mouth daily.   cholecalciferol 1000 units tablet Commonly known as: VITAMIN D Take 1,000 Units by mouth daily.   clobetasol ointment 0.05 % Commonly known as: TEMOVATE Apply 1 application topically 2 (two) times daily as needed (vaginal dryness).   ibandronate 150 MG tablet Commonly known as: BONIVA Take 150 mg by mouth every 30 (thirty) days.   Magnesium 400 MG Caps Take 400 mg by mouth daily.   oxyCODONE 5 MG immediate release tablet Commonly known as: Oxy IR/ROXICODONE Take 1 tablet (5 mg total) by mouth  every 6 (six) hours as needed for severe pain.   SYSTANE OP Place 1 drop into both eyes daily as needed (dry eyes).        Signed: Joyice Faster Maksymilian Mabey 11/14/2020, 3:27 PM

## 2020-11-19 ENCOUNTER — Encounter: Payer: Self-pay | Admitting: *Deleted

## 2020-11-23 LAB — SURGICAL PATHOLOGY

## 2020-11-26 ENCOUNTER — Telehealth: Payer: Self-pay | Admitting: *Deleted

## 2020-11-26 NOTE — Telephone Encounter (Signed)
error 

## 2020-11-28 ENCOUNTER — Telehealth: Payer: Self-pay | Admitting: Oncology

## 2020-11-28 ENCOUNTER — Encounter: Payer: Self-pay | Admitting: *Deleted

## 2020-11-28 NOTE — Telephone Encounter (Signed)
Scheduled appt per 4/27 sch msg. Pt aware.  

## 2020-12-11 NOTE — Progress Notes (Signed)
[place]  [phone] [phone]     ID: Lindsay Leach DOB: 1940/09/29  MR#: [id number]  UYQ#:[id number]  Patient Care Team: Reynold Bowen, MD as PCP - General (Endocrinology) Demichael Traum, Virgie Dad, MD as Consulting Physician (Oncology) Erroll Luna, MD as Consulting Physician (General Surgery) Marylynn Pearson, MD as Consulting Physician (Obstetrics and Gynecology) Mauro Kaufmann, RN as Oncology Nurse Navigator Rockwell Germany, RN as Oncology Nurse Navigator OTHER MD:  I connected with Lindsay Leach on 12/12/20 at  9:15 AM EDT by telephone visit and verified that I am speaking with the correct person using two identifiers.   I discussed the limitations, risks, security and privacy concerns of performing an evaluation and management service by telemedicine and the availability of in-person appointments. I also discussed with the patient that there may be a patient responsible charge related to this service. The patient expressed understanding and agreed to proceed.   Other persons participating in the visit and their role in the encounter: None  Patient's location: Home Provider's location: [place]  Total time spent: 15 min   CHIEF COMPLAINT: functionally triple negative lobular breast cancer  CURRENT TREATMENT: Observation   INTERVAL HISTORY: Lindsay Leach was contacted today for follow up of her functionally triple negative breast cancer. She was evaluated in the breast cancer clinic on 10/30/2020.  She underwent left mastectomy on 11/13/2020 under Dr. Brantley Stage. Pathology from the procedure [id number]) showed: invasive lobular carcinoma, 3.2 cm; lobular carcinoma in situ, pleomorphic; margins not involved.  All three biopsied lymph nodes were negative for metastatic carcinoma (0/3).  Repeat prognostic panel was convincingly triple negative, with an MIB-1 of less than 5%.  Specifically the [facility] results showed a signals ratio  of 1.21 and the number per cell 1.45  Her case was represented 11/28/2020 at the breast cancer multidisciplinary conference and the consensus was observation, no adjuvant radiation.  REVIEW OF SYSTEMS: Lindsay Leach tells me she feels well.  She was in the hospital overnight.  She kept her drains 2 weeks.  Nevertheless she continues to have fluid accumulation and has had drainage x3.  She thinks she might need to have one of the drains replaced.  Aside from that she feels well and is back to her normal functional status she says.  A detailed review of systems was otherwise negative.   COVID 19 VACCINATION STATUS: Status post Moderna x2 with booster   HISTORY OF CURRENT ILLNESS: From the original intake note:  Lindsay Leach has a remote history of right breast cancer in 2004, treated with mastectomy and chemotherapy in [place] (details below).  She recently palpated a left breast area of concern. She underwent left diagnostic mammography with tomography and left breast ultrasonography at [facility] on 10/01/2020 showing: breast density category B; palpable 1.3 cm mass in left breast at 3 o'clock retroareolar with associated suspicious calcifications, all together measuring 2.5 cm; no lymphadenopathy in left axilla.  Accordingly on 10/05/2020 she proceeded to biopsies of the left breast area in question. The pathology from this procedures showed:  1. Left Breast, UOQ ([id number])  - microscopic focus of in situ and invasive mammary carcinoma 2. Left Breast, 3 o'clock retroareolar ([id number])  - invasive and in situ mammary carcinoma, e-cadherin negative, grade 2  - Prognostic indicators significant for: estrogen receptor, 10% positive with weak staining intensity and progesterone receptor, 0% negative. Proliferation marker Ki67 at 2%. HER2 equivocal by immunohistochemistry (2+), but negative by fluorescent in situ hybridization with a signals  ratio 1.15 and number per cell 1.5.  While the first  sample was felt to be concordant, no calcifications were seen in the second specimen and the tissue was likely obtained between the calcifications and the retroareolar mass. She returned on 10/09/2020 for repeat biopsy of the calcifications in the upper-outer left breast. Pathology [id number]) confirmed: invasive lobular carcinoma; lobular carcinoma in situ, pleomorphic features; microcalcifications.  Cancer Staging Malignant neoplasm of central portion of left breast in female, estrogen receptor negative ([place]) Staging form: Breast, AJCC 8th Edition - Clinical stage from 10/09/2020: Stage IIB (cT2, cN0, cM0, G2, ER-, PR-, HER2-) - Signed by Chauncey Cruel, MD on 10/30/2020 Histologic grading system: 3 grade system - Pathologic stage from 11/13/2020: Stage IIA (pT2, pN0, cM0, G2, ER-, PR-, HER2-) - Signed by Gardenia Phlegm, NP on 11/28/2020 Stage prefix: Initial diagnosis Histologic grading system: 3 grade system  The patient's subsequent history is as detailed below.   PAST MEDICAL HISTORY: Past Medical History:  Diagnosis Date  . Arthritis    shoulders  . Breast cancer ([place]) 2004   right  . Colon polyps   . Diverticulosis   . Osteoporosis   . Pancreatitis     PAST SURGICAL HISTORY: Past Surgical History:  Procedure Laterality Date  . BREAST BIOPSY     several  times right 12/2002, left 07/2004 march 11/2000 on left  . CATARACT EXTRACTION Bilateral 12/2016   laser surgery due to a film  afterwards in 01/2000  . CYST EXCISION  2005   [place]  . IR SCLEROTHERAPY OF A FLUID COLLECTION  200, 2006  . MODIFIED RADICAL MASTECTOMY Right 2004  . Phlebectomies  1998  . porth a cath     inserted 67/2004, removed 06/2003  . SENTINEL NODE BIOPSY Left 11/13/2020   Procedure: SENTINEL NODE BIOPSY;  Surgeon: Erroll Luna, MD;  Location: [facility];  Service: General;  Laterality: Left;  . SIMPLE MASTECTOMY WITH AXILLARY SENTINEL NODE BIOPSY Left 11/13/2020   Procedure: LEFT SIMPLE  MASTECTOMY;  Surgeon: Erroll Luna, MD;  Location: [facility];  Service: General;  Laterality: Left;  Marland Kitchen VARICOSE VEIN SURGERY Bilateral 1970  Status post right modified radical mastectomy  FAMILY HISTORY: Family History  Problem Relation Age of Onset  . Lung cancer Father   . Diabetes Maternal Grandfather   . Colon cancer Maternal Uncle    There is no family history of breast or ovarian cancer to her knowledge.  One maternal uncle had colon cancer at advanced age. --The patient tells me she had genetics testing through Dr. Orvan Seen and no mutations were found.   GYNECOLOGIC HISTORY:  No LMP recorded. Patient is postmenopausal. Menarche: 80 years old Age at first live birth: 80 years old [place] P 2 LMP 19 Contraceptive: used pills and IUD HRT more than 10 years  Hysterectomy? no BSO? 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  At home is just she and her husband Lindsay Leach who is a retired Hospital doctor.  He taught at the [employer] [place].  He now has some Parkinson's disease.  The patient moved to this area to be closer to her daughter Lindsay Leach who works in a Runner, broadcasting/film/video.  Lindsay Leach has 2 sons, Lindsay Leach and Lindsay Leach, who are identical twins, Lindsay Leach going to middle [place] and Lindsay Leach going to college in [place].  Daughter Lindsay Leach is a homemaker in [place] she has a daughter Lindsay Leach who is studying to  be a Marine scientist and his son Lindsay Leach who is a Museum/gallery exhibitions officer in college.  The patient is a [employer].   ADVANCED DIRECTIVES: In the absence of any documentation to the contrary, the patient's spouse is their HCPOA.    HEALTH MAINTENANCE: Social History   Tobacco Use  . Smoking status: Never Smoker  . Smokeless tobacco: Never Used  Substance Use Topics  . Alcohol use: No  . Drug use: No     Colonoscopy: 02/2014  PAP:  Bone density:    Allergies  Allergen Reactions  . Advil [Ibuprofen] Rash  . Sulfa Antibiotics Nausea Only    Current Outpatient  Medications  Medication Sig Dispense Refill  . acetaminophen (TYLENOL) 500 MG tablet Take 1,000 mg by mouth every 6 (six) hours as needed for moderate pain or headache.    . cholecalciferol (VITAMIN D) 1000 units tablet Take 1,000 Units by mouth daily.    . clobetasol ointment (TEMOVATE) 1.27 % Apply 1 application topically 2 (two) times daily as needed (vaginal dryness).    . ibandronate (BONIVA) 150 MG tablet Take 150 mg by mouth every 30 (thirty) days.    . Magnesium 400 MG CAPS Take 400 mg by mouth daily.    . Multiple Vitamins-Minerals (CENTRUM SILVER PO) Take 1 tablet by mouth daily.    Marland Kitchen oxyCODONE (OXY IR/ROXICODONE) 5 MG immediate release tablet Take 1 tablet (5 mg total) by mouth every 6 (six) hours as needed for severe pain. 15 tablet 0  . Polyethyl Glycol-Propyl Glycol (SYSTANE OP) Place 1 drop into both eyes daily as needed (dry eyes).     No current facility-administered medications for this visit.    OBJECTIVE: White woman who appears stated age  There were no vitals filed for this visit.   There is no height or weight on file to calculate BMI.   Wt Readings from Last 3 Encounters:  11/13/20 139 lb 15.9 oz (63.5 kg)  10/30/20 141 lb 4.8 oz (64.1 kg)  05/17/19 144 lb 9.6 oz (65.6 kg)      ECOG FS:1 - Symptomatic but completely ambulatory  Telemedicine visit 12/12/2020  LAB RESULTS:  CMP     Component Value Date/Time   NA 141 10/30/2020 1521   K 4.1 10/30/2020 1521   CL 102 10/30/2020 1521   CO2 28 10/30/2020 1521   GLUCOSE 117 (H) 10/30/2020 1521   BUN 14 10/30/2020 1521   CREATININE 0.74 10/30/2020 1521   CALCIUM 9.4 10/30/2020 1521   PROT 7.5 10/30/2020 1521   ALBUMIN 4.4 10/30/2020 1521   AST 20 10/30/2020 1521   ALT 15 10/30/2020 1521   ALKPHOS 76 10/30/2020 1521   BILITOT 0.5 10/30/2020 1521   GFRNONAA >60 10/30/2020 1521   GFRAA >60 04/15/2019 1409    No results found for: TOTALPROTELP, ALBUMINELP, A1GS, A2GS, BETS, BETA2SER, GAMS, MSPIKE,  SPEI  Lab Results  Component Value Date   WBC 4.9 10/30/2020   NEUTROABS 2.8 10/30/2020   HGB 12.9 10/30/2020   HCT 38.8 10/30/2020   MCV 92.6 10/30/2020   PLT 214 10/30/2020    No results found for: LABCA2  No components found for: [id number]  No results for input(s): INR in the last 168 hours.  No results found for: LABCA2  No results found for: SWH675  No results found for: FFM384  No results found for: YKZ993  No results found for: CA2729  No components found for: HGQUANT  No results found for: CEA1 / No results found for: CEA1  No results found for: AFPTUMOR  No results found for: CHROMOGRNA  No results found for: KPAFRELGTCHN, LAMBDASER, KAPLAMBRATIO (kappa/lambda light chains)  No results found for: HGBA, HGBA2QUANT, HGBFQUANT, HGBSQUAN (Hemoglobinopathy evaluation)   No results found for: LDH  No results found for: IRON, TIBC, IRONPCTSAT (Iron and TIBC)  No results found for: FERRITIN  Urinalysis    Component Value Date/Time   COLORURINE STRAW (A) 04/15/2019 [place] 04/15/2019 1437   LABSPEC 1.005 04/15/2019 1437   PHURINE 7.0 04/15/2019 1437   GLUCOSEU NEGATIVE 04/15/2019 1437   HGBUR SMALL (A) 04/15/2019 1437   BILIRUBINUR NEGATIVE 04/15/2019 1437   [place] 04/15/2019 1437   PROTEINUR NEGATIVE 04/15/2019 1437   NITRITE NEGATIVE 04/15/2019 1437   LEUKOCYTESUR NEGATIVE 04/15/2019 1437     STUDIES: NM Sentinel Node Inj-No Rpt (Breast)  Result Date: 11/13/2020 Sulfur colloid was injected by the nuclear medicine technologist for melanoma sentinel node.     ELIGIBLE FOR AVAILABLE RESEARCH PROTOCOL: no  ASSESSMENT: 80 y.o. [place] woman  (1) status post right mastectomy and sentinel lymph node sampling 2004 for aT2N0, triple negative invasive ductal carcinoma.  (a) status post C cyclophosphamide and doxorubicin every 21 days x 4  (2) status post left breast central biopsy 10/05/2020 for a clinical T2N0,  stage IIB invasive lobular carcinoma, E-cadherin negative, functionally triple negative, with an MIB-1 of 2%  (a) a second area  With calcifications biopsied 10/05/2020 was read as a microscopic focus of invasive mammary carcinoma, discordant  (b) repeat biopsy of the second area of concern 10/10/2019 showed pleomorphic invasive lobular carcinoma.  (3) status post left mastectomy and sentinel lymph node sampling 11/13/2020 for a pT2 pN0, stage IIB invasive lobular carcinoma, grade 2, with negative margins.  (a) a total of 3 left axillary lymph nodes removed  (b) repeat prognostic panel triple negative with an MIB-1 of <5%  (4) patient opts against adjuvant chemotherapy  PLAN: [place] did well with her surgery, and will not need adjuvant radiation.  The repeat prognostic panel is convincingly triple negative and she would receive no benefit from antiestrogens.  We again discussed chemotherapy.  She has received it in the past and is quite aware of what is involved.  The benefit in her case at this point would be marginal at best and in any case she opts against it.  Accordingly the plan will be for observation.  She will return to see me in June and we will take it from there      [place]. Magrinat, MD 12/12/2020 9:31 AM Medical Oncology and Hematology [facility] [place], [place] [address] Tel. [phone]    Fax. [phone]   This document serves as a record of services personally performed by Lurline Del, MD. It was created on his behalf by Wilburn Mylar, a trained medical scribe. The creation of this record is based on the scribe's personal observations and the provider's statements to them.   I, Lurline Del MD, have reviewed the above documentation for accuracy and completeness, and I agree with the above.   *Total Encounter Time as defined by the Centers for Medicare and Medicaid Services includes, in addition to the face-to-face time  of a patient visit (documented in the note above) non-face-to-face time: obtaining and reviewing outside history, ordering and reviewing medications, tests or procedures, care coordination (communications with other health care professionals or caregivers) and documentation in the medical record.

## 2020-12-12 ENCOUNTER — Inpatient Hospital Stay: Payer: Medicare Other | Attending: Oncology | Admitting: Oncology

## 2020-12-12 DIAGNOSIS — C50112 Malignant neoplasm of central portion of left female breast: Secondary | ICD-10-CM | POA: Insufficient documentation

## 2020-12-12 DIAGNOSIS — Z171 Estrogen receptor negative status [ER-]: Secondary | ICD-10-CM | POA: Diagnosis not present

## 2021-01-30 ENCOUNTER — Inpatient Hospital Stay: Payer: Medicare Other | Attending: Oncology | Admitting: Oncology

## 2021-01-30 ENCOUNTER — Other Ambulatory Visit: Payer: Self-pay

## 2021-01-30 VITALS — BP 174/80 | HR 107 | Temp 97.9°F | Resp 18 | Wt 140.7 lb

## 2021-01-30 DIAGNOSIS — G20 Parkinson's disease: Secondary | ICD-10-CM | POA: Insufficient documentation

## 2021-01-30 DIAGNOSIS — Z79899 Other long term (current) drug therapy: Secondary | ICD-10-CM | POA: Insufficient documentation

## 2021-01-30 DIAGNOSIS — Z9013 Acquired absence of bilateral breasts and nipples: Secondary | ICD-10-CM | POA: Insufficient documentation

## 2021-01-30 DIAGNOSIS — C50112 Malignant neoplasm of central portion of left female breast: Secondary | ICD-10-CM | POA: Diagnosis not present

## 2021-01-30 DIAGNOSIS — Z171 Estrogen receptor negative status [ER-]: Secondary | ICD-10-CM | POA: Diagnosis not present

## 2021-01-30 NOTE — Progress Notes (Signed)
[facility]  Telephone:([phone]) [phone] Fax:(336) [phone]     ID: Lindsay Leach DOB: [birth year]  MR#: [id number]  QIH#:[id number]  Patient Care Team: Reynold Bowen, MD as PCP - General (Endocrinology) Ramonica Grigg, Virgie Dad, MD as Consulting Physician (Oncology) Erroll Luna, MD as Consulting Physician (General Surgery) Marylynn Pearson, MD as Consulting Physician (Obstetrics and Gynecology) Mauro Kaufmann, RN as Oncology Nurse Navigator Carlynn Spry, Charlott Holler, RN as Oncology Nurse Navigator OTHER MD:   CHIEF COMPLAINT: functionally triple negative lobular breast cancer (s/p bilateral mastectomies)  CURRENT TREATMENT: Observation   INTERVAL HISTORY: Lindsay Leach returns today for follow up of her functionally triple negative breast cancer. She is now under observation.  She has made a good recovery from her surgery.  The interval history is entirely unremarkable.   REVIEW OF SYSTEMS: Lindsay Leach exercises regularly by doing 15 minutes of stretching program, and taking walks which currently are rather short since she goes out with her husband and he has Parkinson's disease.  He is limited in what he can do.  A detailed review of systems today was otherwise stable   COVID 19 VACCINATION STATUS: Status post Moderna x2 with booster   HISTORY OF CURRENT ILLNESS: From the original intake note:  Lindsay Leach has a remote history of right breast cancer in 2004, treated with mastectomy and chemotherapy in [place] (details below).  She recently palpated a left breast area of concern. She underwent left diagnostic mammography with tomography and left breast ultrasonography at [facility] on 10/01/2020 showing: breast density category B; palpable 1.3 cm mass in left breast at 3 o'clock retroareolar with associated suspicious calcifications, all together measuring 2.5 cm; no lymphadenopathy in left axilla.  Accordingly on 10/05/2020 she proceeded to biopsies of the left breast area in  question. The pathology from this procedures showed:  1. Left Breast, UOQ ([id number])  - microscopic focus of in situ and invasive mammary carcinoma 2. Left Breast, 3 o'clock retroareolar ([id number])  - invasive and in situ mammary carcinoma, e-cadherin negative, grade 2  - Prognostic indicators significant for: estrogen receptor, 10% positive with weak staining intensity and progesterone receptor, 0% negative. Proliferation marker Ki67 at 2%. HER2 equivocal by immunohistochemistry (2+), but negative by fluorescent in situ hybridization with a signals ratio 1.15 and number per cell 1.5.  While the first sample was felt to be concordant, no calcifications were seen in the second specimen and the tissue was likely obtained between the calcifications and the retroareolar mass. She returned on 10/09/2020 for repeat biopsy of the calcifications in the upper-outer left breast. Pathology [id number]) confirmed: invasive lobular carcinoma; lobular carcinoma in situ, pleomorphic features; microcalcifications.  Cancer Staging Malignant neoplasm of central portion of left breast in female, estrogen receptor negative ([place]) Staging form: Breast, AJCC 8th Edition - Clinical stage from 10/09/2020: Stage IIB (cT2, cN0, cM0, G2, ER-, PR-, HER2-) - Signed by Chauncey Cruel, MD on 10/30/2020 Histologic grading system: 3 grade system - Pathologic stage from 11/13/2020: Stage IIA (pT2, pN0, cM0, G2, ER-, PR-, HER2-) - Signed by Gardenia Phlegm, NP on 11/28/2020 Stage prefix: Initial diagnosis Histologic grading system: 3 grade system  The patient's subsequent history is as detailed below.   PAST MEDICAL HISTORY: Past Medical History:  Diagnosis Date   Arthritis    shoulders   Breast cancer ([place]) 2004   right   Colon polyps    Diverticulosis    Osteoporosis    Pancreatitis     PAST SURGICAL HISTORY: Past Surgical History:  Procedure Laterality Date   BREAST BIOPSY     several  times right  12/2002, left 07/2004 march 11/2000 on left   CATARACT EXTRACTION Bilateral 12/2016   laser surgery due to a film  afterwards in 01/2000   CYST EXCISION  2005   [facility]   IR SCLEROTHERAPY OF A FLUID COLLECTION  200, 2006   MODIFIED RADICAL MASTECTOMY Right 2004   Phlebectomies  1998   porth a cath     inserted 67/2004, removed 06/2003   SENTINEL NODE BIOPSY Left 11/13/2020   Procedure: SENTINEL NODE BIOPSY;  Surgeon: Erroll Luna, MD;  Location: [facility];  Service: General;  Laterality: Left;   SIMPLE MASTECTOMY WITH AXILLARY SENTINEL NODE BIOPSY Left 11/13/2020   Procedure: LEFT SIMPLE MASTECTOMY;  Surgeon: Erroll Luna, MD;  Location: [facility];  Service: General;  Laterality: Left;   VARICOSE VEIN SURGERY Bilateral 1970  Status post right modified radical mastectomy   FAMILY HISTORY: Family History  Problem Relation Age of Onset   Lung cancer Father    Diabetes Maternal Grandfather    Colon cancer Maternal Uncle    There is no family history of breast or ovarian cancer to her knowledge.  One maternal uncle had colon cancer at advanced age. --The patient tells me she had genetics testing through Dr. Orvan Seen and no mutations were found.   GYNECOLOGIC HISTORY:  No LMP recorded. Patient is postmenopausal. Menarche: 80 years old Age at first live birth: 80 years old [place] P 2 LMP 27 Contraceptive: used pills and IUD HRT more than 10 years  Hysterectomy? no BSO? 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  At home is just she and her husband Timmothy Sours who is a retired Hospital doctor.  He taught at the [employer] of [place].  He now has Parkinson's disease.  The patient moved to this area to be closer to her daughter Suella Grove who works in a Runner, broadcasting/film/video.  Lindsay Leach has 2 sons, Lindsay Leach and Lindsay Leach, who are identical twins, Lindsay Leach going to middle [place] and Lindsay Leach going to college in [place].  The patient's second daughter Lindsay Leach is a homemaker in  [place] and has 2 children of her own, a daughter Lindsay Leach who is studying to be a Marine scientist and a son Aaron Edelman who is a Museum/gallery exhibitions officer in college.  The patient is a [employer].   ADVANCED DIRECTIVES: In the absence of any documentation to the contrary, the patient's spouse is their HCPOA.    HEALTH MAINTENANCE: Social History   Tobacco Use   Smoking status: Never   Smokeless tobacco: Never  Substance Use Topics   Alcohol use: No   Drug use: No     Colonoscopy: 02/2014  PAP:  Bone density:    Allergies  Allergen Reactions   Advil [Ibuprofen] Rash   Sulfa Antibiotics Nausea Only    Current Outpatient Medications  Medication Sig Dispense Refill   acetaminophen (TYLENOL) 500 MG tablet Take 1,000 mg by mouth every 6 (six) hours as needed for moderate pain or headache.     cholecalciferol (VITAMIN D) 1000 units tablet Take 1,000 Units by mouth daily.     clobetasol ointment (TEMOVATE) 2.42 % Apply 1 application topically 2 (two) times daily as needed (vaginal dryness).     ibandronate (BONIVA) 150 MG tablet Take 150 mg by mouth every 30 (thirty) days.     Magnesium 400 MG CAPS Take 400 mg by mouth daily.  Multiple Vitamins-Minerals (CENTRUM SILVER PO) Take 1 tablet by mouth daily.     oxyCODONE (OXY IR/ROXICODONE) 5 MG immediate release tablet Take 1 tablet (5 mg total) by mouth every 6 (six) hours as needed for severe pain. 15 tablet 0   Polyethyl Glycol-Propyl Glycol (SYSTANE OP) Place 1 drop into both eyes daily as needed (dry eyes).     No current facility-administered medications for this visit.    OBJECTIVE: White woman in no acute distress  Vitals:   01/30/21 1500  BP: (!) 174/80  Pulse: (!) 107  Resp: 18  Temp: 97.9 F (36.6 C)  SpO2: 96%     Body mass index is 22.37 kg/m.   Wt Readings from Last 3 Encounters:  01/30/21 140 lb 11.2 oz (63.8 kg)  11/13/20 139 lb 15.9 oz (63.5 kg)  10/30/20 141 lb 4.8 oz (64.1 kg)      ECOG FS:1 - Symptomatic but completely  ambulatory  Sclerae unicteric, EOMs intact Wearing a mask No cervical or supraclavicular adenopathy Lungs no rales or rhonchi Heart regular rate and rhythm Abd soft, nontender, positive bowel sounds MSK no focal spinal tenderness, no upper extremity lymphedema Neuro: nonfocal, well oriented, appropriate affect Breasts: Status post bilateral mastectomies.  Both incisions have healed well.  The incision on the right is flat and smooth.  The incision on the left is slightly more convoluted.  There is no evidence of residual or recurrent disease.  Both axillae are benign.  LAB RESULTS:  CMP     Component Value Date/Time   NA 141 10/30/2020 1521   K 4.1 10/30/2020 1521   CL 102 10/30/2020 1521   CO2 28 10/30/2020 1521   GLUCOSE 117 (H) 10/30/2020 1521   BUN 14 10/30/2020 1521   CREATININE 0.74 10/30/2020 1521   CALCIUM 9.4 10/30/2020 1521   PROT 7.5 10/30/2020 1521   ALBUMIN 4.4 10/30/2020 1521   AST 20 10/30/2020 1521   ALT 15 10/30/2020 1521   ALKPHOS 76 10/30/2020 1521   BILITOT 0.5 10/30/2020 1521   GFRNONAA >60 10/30/2020 1521   GFRAA >60 04/15/2019 1409    No results found for: TOTALPROTELP, ALBUMINELP, A1GS, A2GS, BETS, BETA2SER, GAMS, MSPIKE, SPEI  Lab Results  Component Value Date   WBC 4.9 10/30/2020   NEUTROABS 2.8 10/30/2020   HGB 12.9 10/30/2020   HCT 38.8 10/30/2020   MCV 92.6 10/30/2020   PLT 214 10/30/2020    No results found for: LABCA2  No components found for: ZCHYIF027  No results for input(s): INR in the last 168 hours.  No results found for: LABCA2  No results found for: XAJ287  No results found for: OMV672  No results found for: CNO709  No results found for: CA2729  No components found for: HGQUANT  No results found for: CEA1 / No results found for: CEA1   No results found for: AFPTUMOR  No results found for: CHROMOGRNA  No results found for: KPAFRELGTCHN, LAMBDASER, KAPLAMBRATIO (kappa/lambda light chains)  No results found  for: HGBA, HGBA2QUANT, HGBFQUANT, HGBSQUAN (Hemoglobinopathy evaluation)   No results found for: LDH  No results found for: IRON, TIBC, IRONPCTSAT (Iron and TIBC)  No results found for: FERRITIN  Urinalysis    Component Value Date/Time   COLORURINE STRAW (A) 04/15/2019 [place] 04/15/2019 1437   LABSPEC 1.005 04/15/2019 1437   PHURINE 7.0 04/15/2019 1437   GLUCOSEU NEGATIVE 04/15/2019 1437   [place] (A) 04/15/2019 [place] 04/15/2019 1437  KETONESUR NEGATIVE 04/15/2019 1437   PROTEINUR NEGATIVE 04/15/2019 1437   NITRITE NEGATIVE 04/15/2019 1437   LEUKOCYTESUR NEGATIVE 04/15/2019 1437    STUDIES: No results found.   ELIGIBLE FOR AVAILABLE RESEARCH PROTOCOL: no  ASSESSMENT: 80 y.o. [place] woman  (1) status post right mastectomy and sentinel lymph node sampling 2004 for aT2N0, triple negative invasive ductal carcinoma.  (a) status post cyclophosphamide and doxorubicin every 21 days x 4  (2) status post left breast central biopsy 10/05/2020 for a clinical T2N0, stage IIB invasive lobular carcinoma, E-cadherin negative, functionally triple negative, with an MIB-1 of 2%  (a) a second area  With calcifications biopsied 10/05/2020 was read as a microscopic focus of invasive mammary carcinoma, discordant  (b) repeat biopsy of the second area of concern 10/10/2019 showed pleomorphic invasive lobular carcinoma.  (3) status post left mastectomy and sentinel lymph node sampling 11/13/2020 for a pT2 pN0, stage IIB invasive lobular carcinoma, grade 2, with negative margins.  (a) a total of 3 left axillary lymph nodes removed  (b) repeat prognostic panel triple negative with an MIB-1 of <5%  (4) patient opted against adjuvant chemotherapy   PLAN: Madalyn is now a little over 2 months out from definitive surgery for her breast cancer.  There is no evidence of residual or recurrent disease.  She is doing fairly well with bras and prostheses  and does not plan on reconstruction.  I commended her exercise program.  We again reviewed her decision against chemotherapy and given her age as well as the very low proliferation fraction of this lobular breast cancer it is not clear she would have obtained significant risk reduction from chemotherapy if she had opted for it.  All she needs at this point is a chest wall physical exam and she obtains this through her gynecologist Dr. Orvan Seen.  We would be glad to continue to see Janat but we are really only duplicating effort.  Accordingly I am comfortable releasing her to her primary physician's care at this point.  I will be glad to see Jenny Reichmann again at any point in the future if and when the need arises but as of now are making no further routine appointments for her here.  Total encounter time 20 minutes.Sarajane Jews C. Kymani Shimabukuro, MD 01/30/2021 3:18 PM Medical Oncology and Hematology [facility], [place] [address] Tel. [phone]    Fax. [phone]   This document serves as a record of services personally performed by Lurline Del, MD. It was created on his behalf by Wilburn Mylar, a trained medical scribe. The creation of this record is based on the scribe's personal observations and the provider's statements to them.   I, Lurline Del MD, have reviewed the above documentation for accuracy and completeness, and I agree with the above.   *Total Encounter Time as defined by the Centers for Medicare and Medicaid Services includes, in addition to the face-to-face time of a patient visit (documented in the note above) non-face-to-face time: obtaining and reviewing outside history, ordering and reviewing medications, tests or procedures, care coordination (communications with other health care professionals or caregivers) and documentation in the medical record.

## 2021-08-07 ENCOUNTER — Encounter (HOSPITAL_BASED_OUTPATIENT_CLINIC_OR_DEPARTMENT_OTHER): Payer: Self-pay | Admitting: Emergency Medicine

## 2021-08-07 ENCOUNTER — Other Ambulatory Visit: Payer: Self-pay

## 2021-08-07 ENCOUNTER — Emergency Department (HOSPITAL_BASED_OUTPATIENT_CLINIC_OR_DEPARTMENT_OTHER)
Admission: EM | Admit: 2021-08-07 | Discharge: 2021-08-07 | Disposition: A | Discharge status: Home / Self Care | Payer: Medicare Other | Attending: Emergency Medicine | Admitting: Emergency Medicine

## 2021-08-07 DIAGNOSIS — Z79899 Other long term (current) drug therapy: Secondary | ICD-10-CM | POA: Insufficient documentation

## 2021-08-07 DIAGNOSIS — I10 Essential (primary) hypertension: Secondary | ICD-10-CM | POA: Diagnosis not present

## 2021-08-07 DIAGNOSIS — R030 Elevated blood-pressure reading, without diagnosis of hypertension: Secondary | ICD-10-CM

## 2021-08-07 DIAGNOSIS — R000 Tachycardia, unspecified: Secondary | ICD-10-CM | POA: Diagnosis not present

## 2021-08-07 DIAGNOSIS — R42 Dizziness and giddiness: Secondary | ICD-10-CM | POA: Insufficient documentation

## 2021-08-07 DIAGNOSIS — Z853 Personal history of malignant neoplasm of breast: Secondary | ICD-10-CM | POA: Insufficient documentation

## 2021-08-07 LAB — URINALYSIS, ROUTINE W REFLEX MICROSCOPIC
Bilirubin Urine: NEGATIVE
Glucose, UA: NEGATIVE mg/dL
Ketones, ur: NEGATIVE mg/dL
Leukocytes,Ua: NEGATIVE
Nitrite: NEGATIVE
Protein, ur: 30 mg/dL — AB
Specific Gravity, Urine: 1.019 (ref 1.005–1.030)
pH: 6 (ref 5.0–8.0)

## 2021-08-07 LAB — BASIC METABOLIC PANEL
Anion gap: 10 (ref 5–15)
BUN: 12 mg/dL (ref 8–23)
CO2: 29 mmol/L (ref 22–32)
Calcium: 9.8 mg/dL (ref 8.9–10.3)
Chloride: 97 mmol/L — ABNORMAL LOW (ref 98–111)
Creatinine, Ser: 0.6 mg/dL (ref 0.44–1.00)
GFR, Estimated: >60 mL/min (ref >60)
Glucose, Bld: 136 mg/dL — ABNORMAL HIGH (ref 70–99)
Potassium: 3.8 mmol/L (ref 3.5–5.1)
Sodium: 136 mmol/L (ref 135–145)

## 2021-08-07 LAB — CBC
HCT: 41 % (ref 36.0–46.0)
Hemoglobin: 13.4 g/dL (ref 12.0–15.0)
MCH: 30.3 pg (ref 26.0–34.0)
MCHC: 32.7 g/dL (ref 30.0–36.0)
MCV: 92.8 fL (ref 80.0–100.0)
Platelets: 229 10*3/uL (ref 150–400)
RBC: 4.42 MIL/uL (ref 3.87–5.11)
RDW: 12.7 % (ref 11.5–15.5)
WBC: 5.4 10*3/uL (ref 4.0–10.5)
nRBC: 0 % (ref 0.0–0.2)

## 2021-08-07 LAB — CBG MONITORING, ED: Glucose-Capillary: 134 mg/dL — ABNORMAL HIGH (ref 70–99)

## 2021-08-07 NOTE — ED Triage Notes (Signed)
Pt reports had dizziness this am rolling in the bed , dizzy after standing , near syncope . Hx HTN

## 2021-08-07 NOTE — Discharge Instructions (Signed)
As we discussed we do not find a clear reason for your lightheadedness today, however based on the test that we did run it does not appear that you are dehydrated, your glucose has been stable, your EKG and heart sound good on our exam, despite being a little bit fast over the course of your visit.  Your blood pressure has been elevated for much of your stay, if your blood pressure continues to remain elevated I recommend that you touch base with your doctor to discuss further management of your hypertension, discuss whether or medication or a different medication may be helpful in this case.  In the meantime if you experience recurrence of this symptom please return to the emergency department for further evaluation especially if you have total loss of consciousness.

## 2021-08-07 NOTE — ED Provider Notes (Signed)
[facility] EM[facility] DEPT Provider Note   CSN: [id number] Arrival date & time: 08/07/21  6606     History  Chief Complaint  Patient presents with   Dizziness    Carlisle Torgeson is a 81 y.o. female with a past medical history significant for history of tobacco use, palpitations, tachycardia, hypertension, breast cancer, and anxiety who presents with complaints of waking up this morning feeling dizzy after standing when she was going to pass out.  Patient reports that since then she has felt overall normal, if a little anxious.  Patient reports that she typically has somewhat elevated blood pressure, heart rate when she is at the doctor secondary to "whitecoat syndrome".  Patient denies any chest pain, shortness of breath, abdominal pain, nausea, vomiting, dysuria, hematuria.  Patient does report that she has had slight increase in night sweats recently, waking up feeling sweat in her groin and armpits, reports that she has not had similar feeling since she was going through menopause.  Patient denies any weakness, numbness, tingling.   Dizziness Associated symptoms: palpitations       Home Medications Prior to Admission medications   Medication Sig Start Date End Date Taking? Authorizing Provider  acetaminophen (TYLENOL) 500 MG tablet Take 1,000 mg by mouth every 6 (six) hours as needed for moderate pain or headache.    [provider]  cholecalciferol (VITAMIN D) 1000 units tablet Take 1,000 Units by mouth daily.    [provider]  citalopram (CELEXA) 10 MG tablet Take 1 tablet (10 mg total) by mouth daily. 01/30/21   Magrinat, Virgie Dad, MD  clobetasol ointment (TEMOVATE) 3.01 % Apply 1 application topically 2 (two) times daily as needed (vaginal dryness). 09/18/20   [provider]  ibandronate (BONIVA) 150 MG tablet Take 150 mg by mouth every 30 (thirty) days. 03/15/19   [provider]  Magnesium 400 MG CAPS Take 400 mg by mouth daily.     [provider]  metoprolol tartrate (LOPRESSOR) 25 MG tablet Take 1 tablet (25 mg total) by mouth 2 (two) times daily. 01/30/21   Magrinat, Virgie Dad, MD  Multiple Vitamins-Minerals (CENTRUM SILVER PO) Take 1 tablet by mouth daily.    [provider]  oxyCODONE (OXY IR/ROXICODONE) 5 MG immediate release tablet Take 1 tablet (5 mg total) by mouth every 6 (six) hours as needed for severe pain. 11/14/20   Cornett, Marcello Moores, MD  Polyethyl Glycol-Propyl Glycol (SYSTANE OP) Place 1 drop into both eyes daily as needed (dry eyes).    [provider]      Allergies    Advil [ibuprofen] and Sulfa antibiotics    Review of Systems   Review of Systems  Cardiovascular:  Positive for palpitations.  Neurological:  Positive for dizziness.  All other systems reviewed and are negative.  Physical Exam Updated Vital Signs BP (!) 175/76 (BP Location: Right Arm)    Pulse 94    Temp 97.9 F (36.6 C) (Oral)    Resp 15    Ht 5' 6.5" (1.689 m)    Wt 63.5 kg    SpO2 98%    BMI 22.26 kg/m  Physical Exam Vitals and nursing note reviewed.  Constitutional:      General: She is not in acute distress.    Appearance: Normal appearance.  HENT:     Head: Normocephalic and atraumatic.  Eyes:     General:        Right eye: No discharge.  Left eye: No discharge.     Extraocular Movements: Extraocular movements intact.     Pupils: Pupils are equal, round, and reactive to light.  Cardiovascular:     Rate and Rhythm: Regular rhythm. Tachycardia present.     Heart sounds: No murmur heard.   No friction rub. No gallop.     Comments: Patient has had a baseline elevated heart rate since she has been here, she is tachycardic on my exam, at rest she is around 90-93. Pulmonary:     Effort: Pulmonary effort is normal.     Breath sounds: Normal breath sounds.  Abdominal:     General: Bowel sounds are normal.     Palpations: Abdomen is soft.  Skin:    General: Skin is warm and dry.      Capillary Refill: Capillary refill takes less than 2 seconds.  Neurological:     Mental Status: She is alert and oriented to person, place, and time.     Comments: CN III through XII grossly intact.  ANO x3.  Intact finger-nose.  No pronator drift.  Intact strength 5 out of 5 bilateral upper and lower extremities.  Romberg negative, gait normal.  Patient does express minimal amount of "dizziness" during Romberg.  Psychiatric:        Mood and Affect: Mood normal.        Behavior: Behavior normal.    ED Results / Procedures / Treatments   Labs (all labs ordered are listed, but only abnormal results are displayed) Labs Reviewed  BASIC METABOLIC PANEL - Abnormal; Notable for the following components:      Result Value   Chloride 97 (*)    Glucose, Bld 136 (*)    All other components within normal limits  URINALYSIS, ROUTINE W REFLEX MICROSCOPIC - Abnormal; Notable for the following components:   Hgb urine dipstick TRACE (*)    Protein, ur 30 (*)    Bacteria, UA RARE (*)    All other components within normal limits  CBG MONITORING, ED - Abnormal; Notable for the following components:   Glucose-Capillary 134 (*)    All other components within normal limits  CBC    EKG EKG Interpretation  Date/Time:  Wednesday August 07 2021 10:06:26 EST Ventricular Rate:  113 PR Interval:  148 QRS Duration: 74 QT Interval:  310 QTC Calculation: 425 R Axis:   76 Text Interpretation: Sinus tachycardia Biatrial enlargement Abnormal ECG When compared with ECG of 15-Apr-2019 13:43, PREVIOUS ECG IS PRESENT No significant change since last tracing Confirmed by Regan Lemming ([id number]) on 08/07/2021 10:58:03 AM  Radiology No results found.  Procedures Procedures    Medications Ordered in ED Medications - No data to display  ED Course/ Medical Decision Making/ A&P                           Medical Decision Making  I discussed this case with my attending physician who cosigned this note including  patient's presenting symptoms, physical exam, and planned diagnostics and interventions. Attending physician stated agreement with plan or made changes to plan which were implemented.   Attending physician assessed patient at bedside.  This patient presents to the ED for concern of dizziness, near-syncope this morning, this involves an extensive number of treatment options, and is a complaint that carries with it a high risk of complications and morbidity. The emergent differential diagnosis includes, but is not limited to, ACS, arrhythmia, stroke, hypoglycemia, hypotension.  Co morbidities that complicate the patient evaluation:   Additional history obtained from husband External records from outside source obtained and reviewed including previous notes including breast cancer treatment notes.  Physical exam performed. The pertinent findings include: Focal neurologic findings, no orthostatic hypotension.  Patient has remained somewhat hypertensive, with transient tachycardia throughout her stay.  She has not had any feelings of dizziness or lightheadedness throughout her stay.  Lab Tests: I Ordered, and personally interpreted labs.  The pertinent results include: CBG of 134, slight hypochloremia of 97, no evidence urinary tract infection on UA.   Cardiac Monitoring: The patient was maintained on a cardiac monitor.  My attending physician Dr. Armandina Gemma viewed and interpreted the cardiac monitored which showed an underlying rhythm of: sinus tachycardia.  Overall patient does not have any evidence of an arrhythmia, she has no chest pain, no shortness of breath, no lingering symptoms of her near syncope feeling earlier today.  In context of normal EKG, normal electrolytes, and overall well-appearing patient with no focal neurologic findings discussed with my attending physician I do not believe that there is any need for further evaluation of this patient today.  Discussed with patient that it is  possible that she woke up, with somewhat low blood sugar, or is dehydrated upon awaking, versus transient lightheadedness of unclear origin, with possible association of anxiety.  Patient has been experiencing some anxiety symptoms and has recently been started on escitalopram.  She has had presignificant hypertension while she has been here, although she reports that she normally has whitecoat syndrome, I do recommend that she follow-up, continue taking her blood pressure and discuss whether she needs tighter control of her blood pressure with her primary care doctor at her earliest convenience.  At this time believe patient is stable for discharge, I have minimal clinical concern for ACS, PE without significant risk factors, she does have a history of breast cancer with raises her chance of a blood clot, however she has no unilateral leg swelling, redness, pain, and no cardiac or respiratory symptoms whatsoever.  Patient stable for discharge, and discharge at this time with return precautions.  Final Clinical Impression(s) / ED Diagnoses Final diagnoses:  Lightheadedness  Elevated blood pressure reading    Rx / DC Orders ED Discharge Orders     None         Dorien Chihuahua 08/07/21 1419    Regan Lemming, MD 08/07/21 1928

## 2022-07-01 IMAGING — US US BREAST BX W LOC DEV 1ST LESION IMG BX SPEC US GUIDE*L*
1 series · 12 of 12 positions shown · non-contrast
Comparison: Previous exam(s).
COMPARISON: Previous exam(s).

Addendum:
CLINICAL DATA: Patient with an irregular mass in the retroareolar
LEFT breast presents today for ultrasound-guided core biopsy.

Patient also with indeterminate calcifications in the outer LEFT
breast for which a stereotactic biopsy will be performed later
today.
EXAM:
ULTRASOUND GUIDED LEFT BREAST CORE NEEDLE BIOPSY

[Series 1: us breast bx w loc dev 1st lesion img bx spec us g · 0.07mm/px · 12 of 12 slices shown]
[im 1/12]
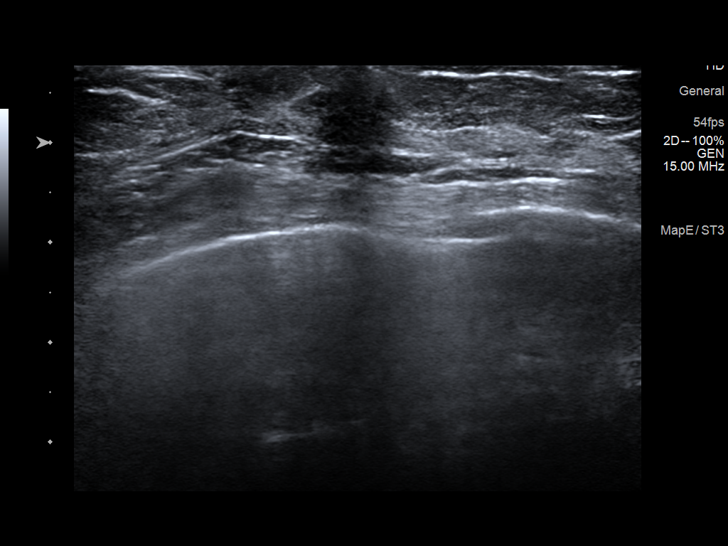
[im 2/12]
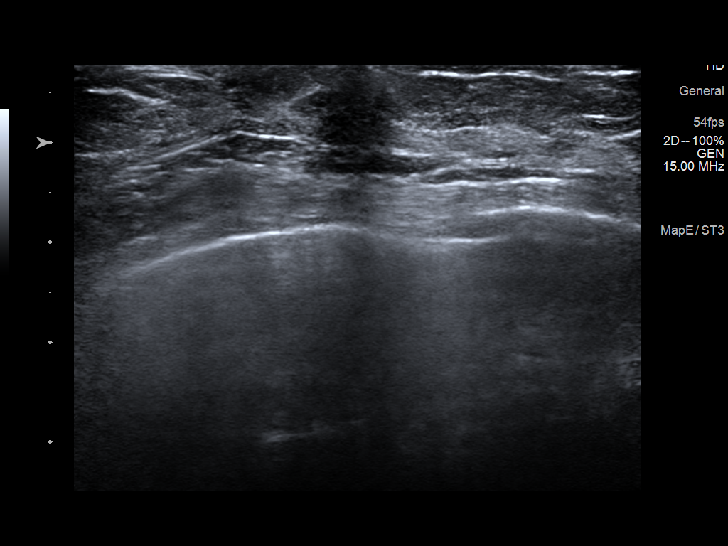
[im 3/12]
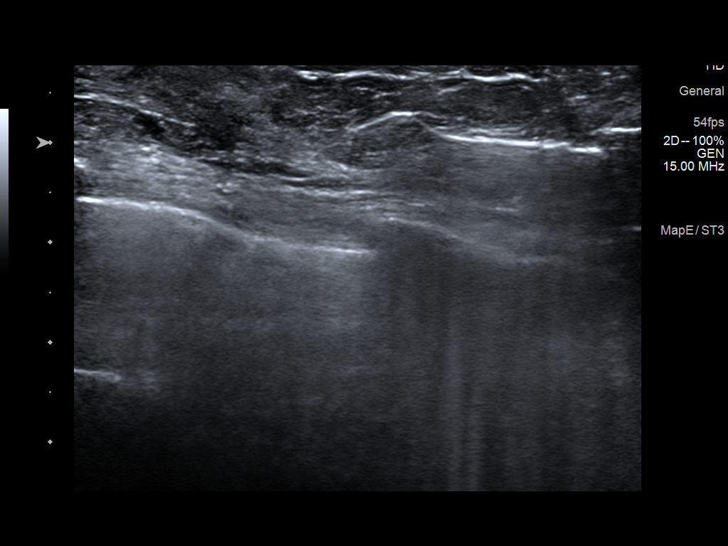
[im 4/12]
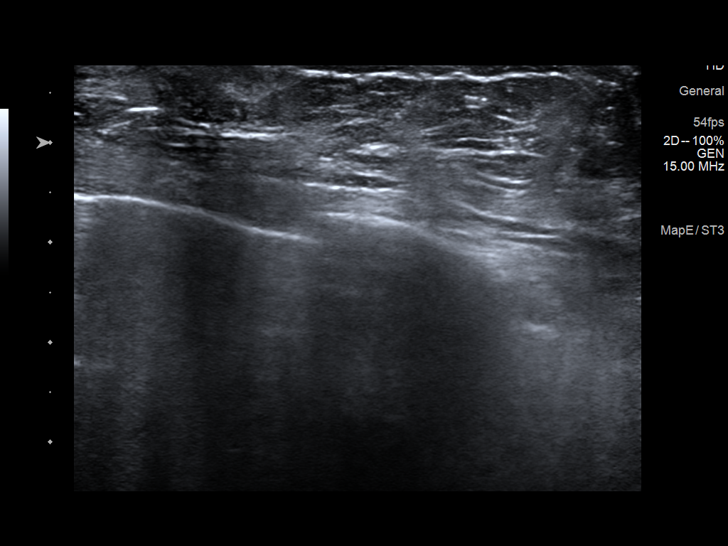
[im 5/12]
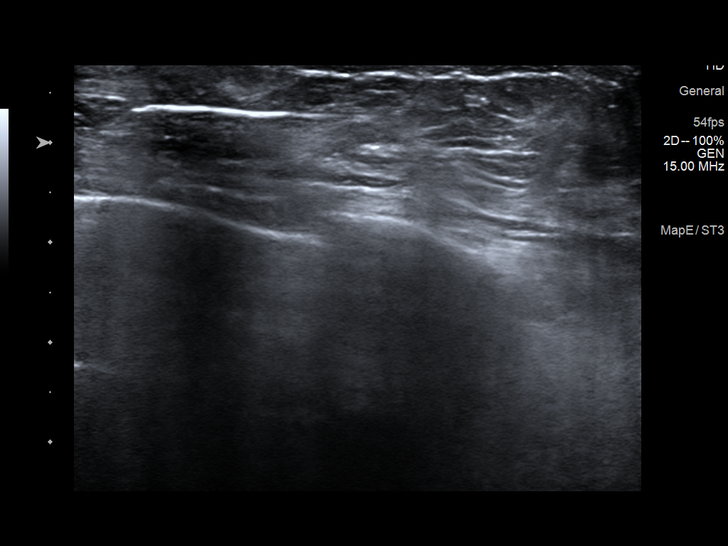
[im 6/12]
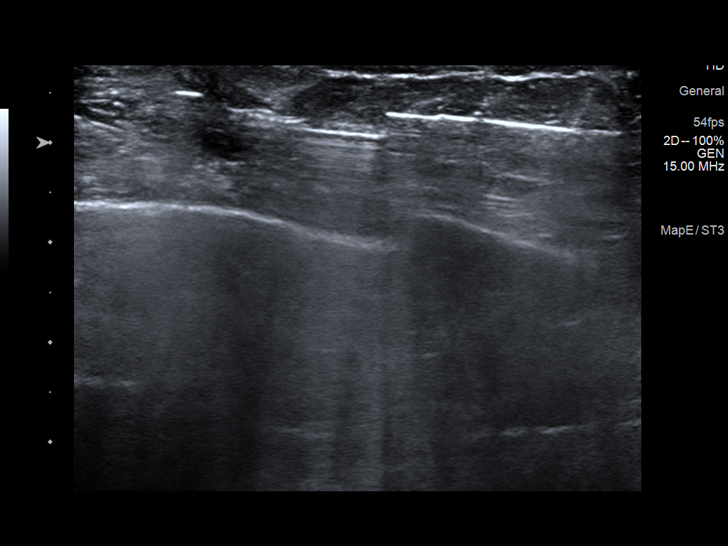
[im 7/12]
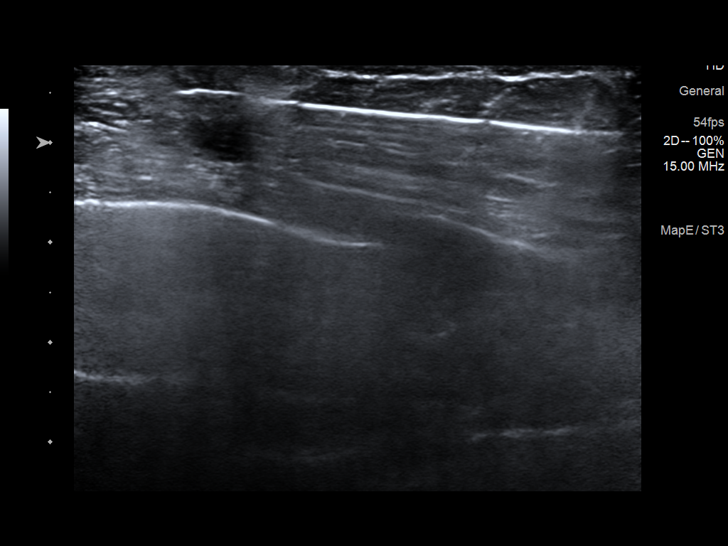
[im 8/12]
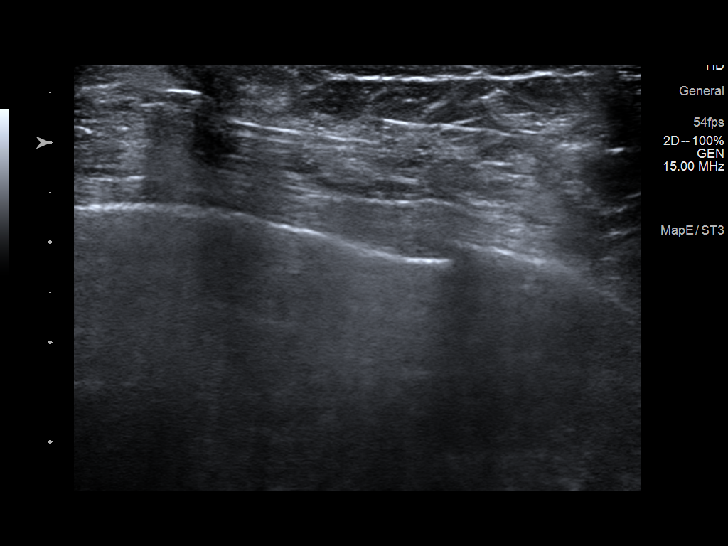
[im 9/12]
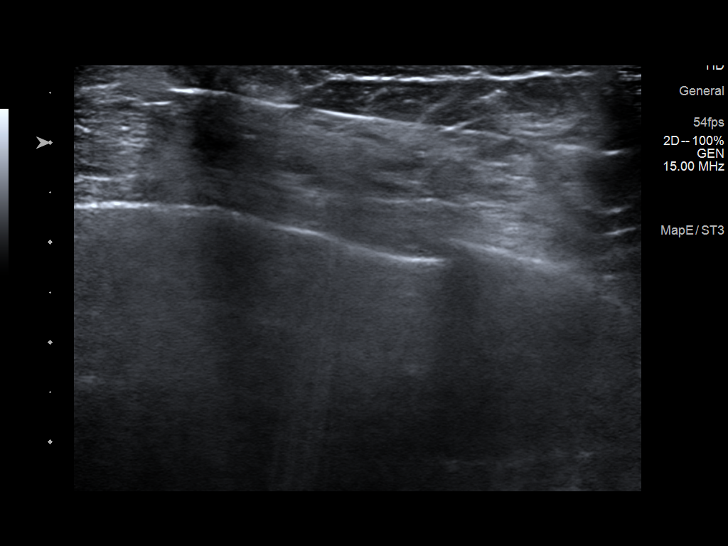
[im 10/12]
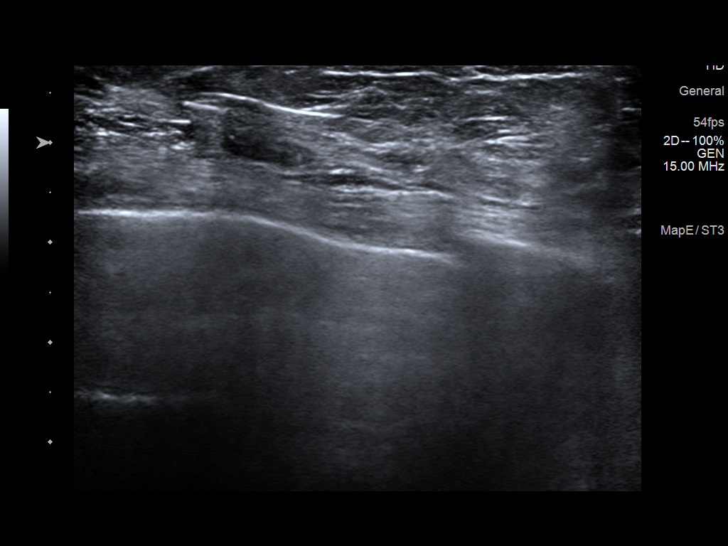
[im 11/12]
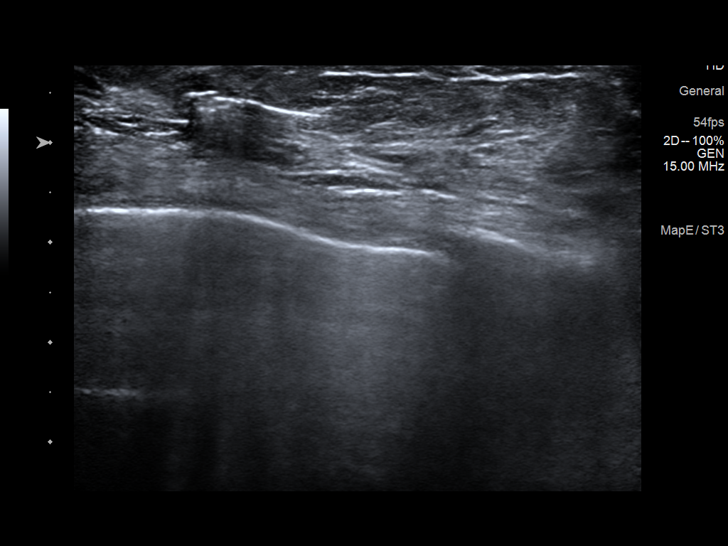
[im 12/12]
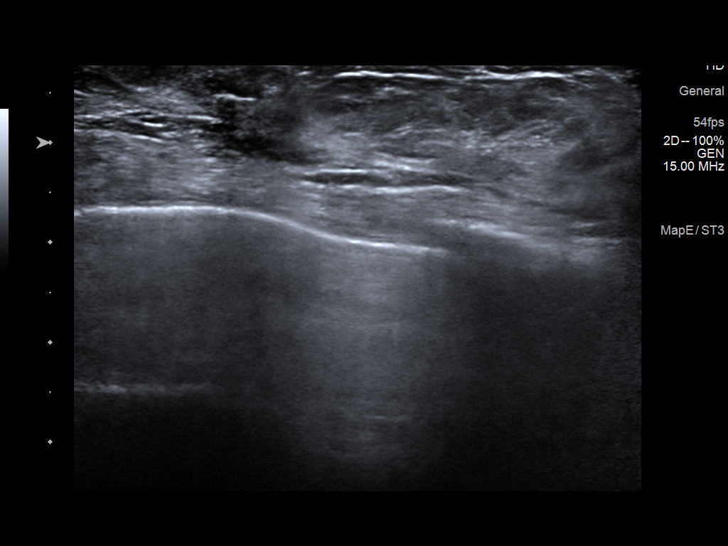

[12 of 12 positions shown; findings below may reference images not displayed]



Lesion quadrant: Retroareolar

Using sterile technique and 1% Lidocaine as local anesthetic, under
direct ultrasound visualization, a 12 gauge Maideen device was
used to perform biopsy of the mass in the retroareolar LEFT breast,
3 o'clock axis, using a lateral approach. At the conclusion of the
procedure ribbon shaped tissue marker clip was deployed into the
biopsy cavity. Follow up 2 view mammogram was performed and dictated
separately.
IMPRESSION: Ultrasound guided biopsy of the mass in the retroareolar LEFT
breast, 3 o'clock axis. No apparent complications.

ADDENDUM:
1- Ultrasound Biopsy - pathology revealed GRADE II INVASIVE MAMMARY
CARCINOMA. MAMMARY CARCINOMA IN SITU of the LEFT breast, 3 o'clock,
retroareolar. This was found to be concordant by Dr. Chavela Loring.

2- Stereotactic Biopsy - pathology revealed MICROSCOPIC FOCUS OF
IN-SITU AND INVASIVE MAMMARY CARCINOMA of the LEFT breast, upper
outer quadrant. This was found to be concordant by Dr. Chavela Loring.
SEE BELOW FOR DETAILS.

Due to equipment failure during the stereotactic biopsy, no
calcifications were seen in second specimen, so no clip was placed
for the second biopsy site. However, after removing the biopsy
device from the breast, there was scant tissue seen so this scant
tissue was sent to lab for histology. This tissue was likely
obtained between the calcifications and the retroareolar mass.

Patient has been rescheduled for stereotactic biopsy of the
calcifications in the upper outer quadrant of the left breast
despite the positive result of microscopic focus of in-site and
invasive mammary carcinoma in order to ensure adequate defining of
the extent of disease. as, again, this tissue was likely obtained
between the calcifications and the retroareolar mass.

Pathology results were discussed with the patient by telephone. The
patient reported doing well after the biopsies with tenderness at
the sites. Post biopsy instructions and care were reviewed and
questions were answered. The patient was encouraged to call The

The patient was instructed to return for repeat LEFT breast
stereotactic guided biopsy on October 09, 2020 @0019 at [facility] [[email or web address]].

Surgical consultation has been arranged with Dr. Jewgeni Meng at
[[email or web address]] on October 16, 2020.

Pathology results reported by Apeko Cmanja RN on 10/09/2020.



Lesion quadrant: Retroareolar

Using sterile technique and 1% Lidocaine as local anesthetic, under
direct ultrasound visualization, a 12 gauge Maideen device was
used to perform biopsy of the mass in the retroareolar LEFT breast,
3 o'clock axis, using a lateral approach. At the conclusion of the
procedure ribbon shaped tissue marker clip was deployed into the
biopsy cavity. Follow up 2 view mammogram was performed and dictated
separately.
IMPRESSION: Ultrasound guided biopsy of the mass in the retroareolar LEFT
breast, 3 o'clock axis. No apparent complications.

## 2022-07-05 IMAGING — MG MM BREAST LOCALIZATION CLIP
4 series · 4 of 12 positions shown · non-contrast
Comparison: Previous exam(s).

CLINICAL DATA: Status post stereotactic biopsy today for
indeterminate calcifications within the outer LEFT breast.
Stereotactic biopsy attempt for these same calcifications was
unsuccessful on 10/05/2020 due to equipment failure.

Status post ultrasound-guided biopsy on 10/05/2020 for retroareolar
LEFT breast mass revealing malignancy.
EXAM:
DIAGNOSTIC LEFT MAMMOGRAM POST STEREOTACTIC BIOPSY

[L ML synth-2D]
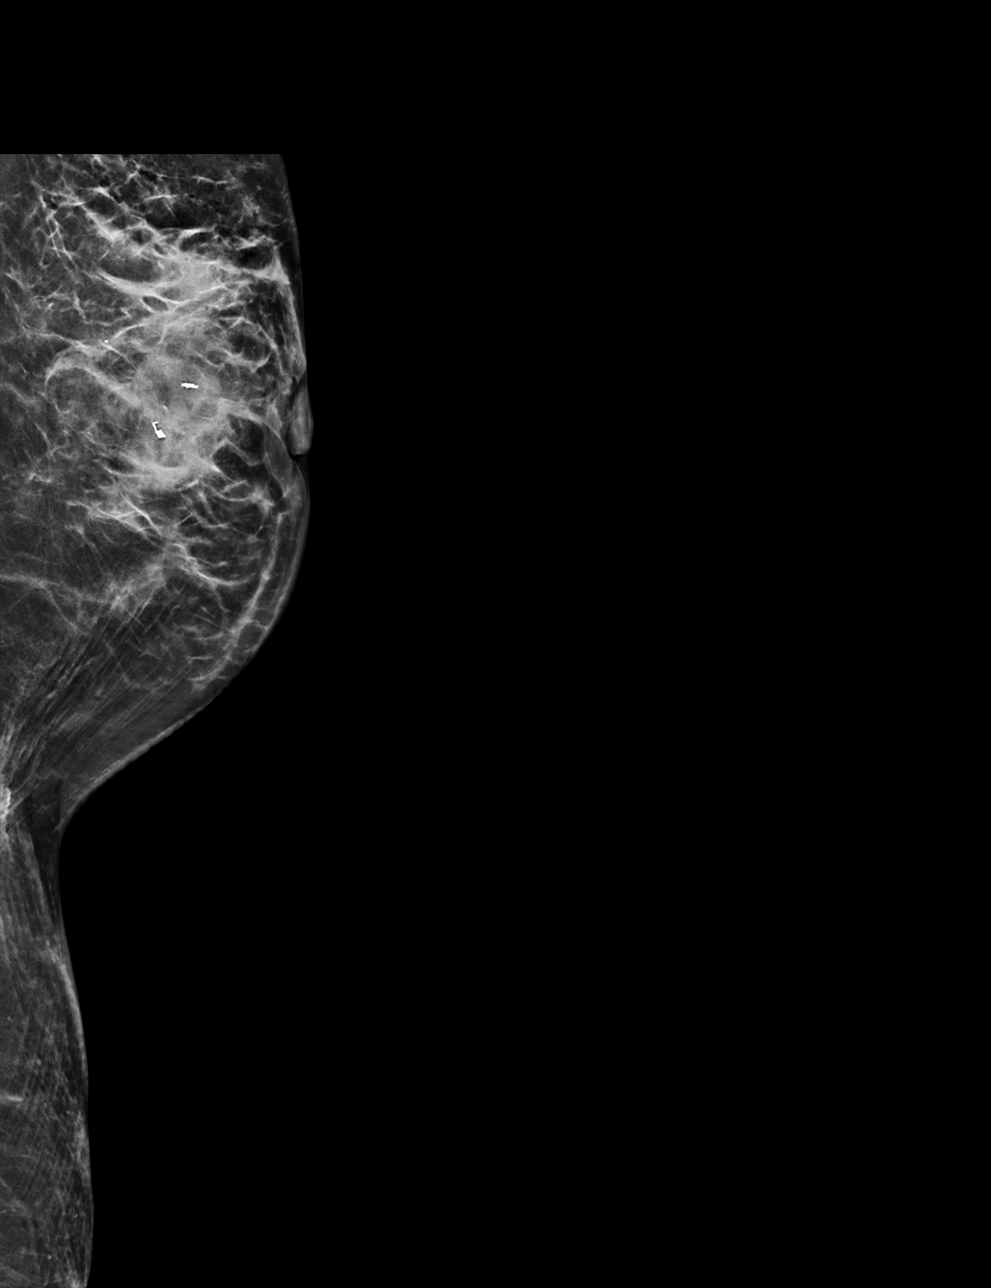

[L CC synth-2D]
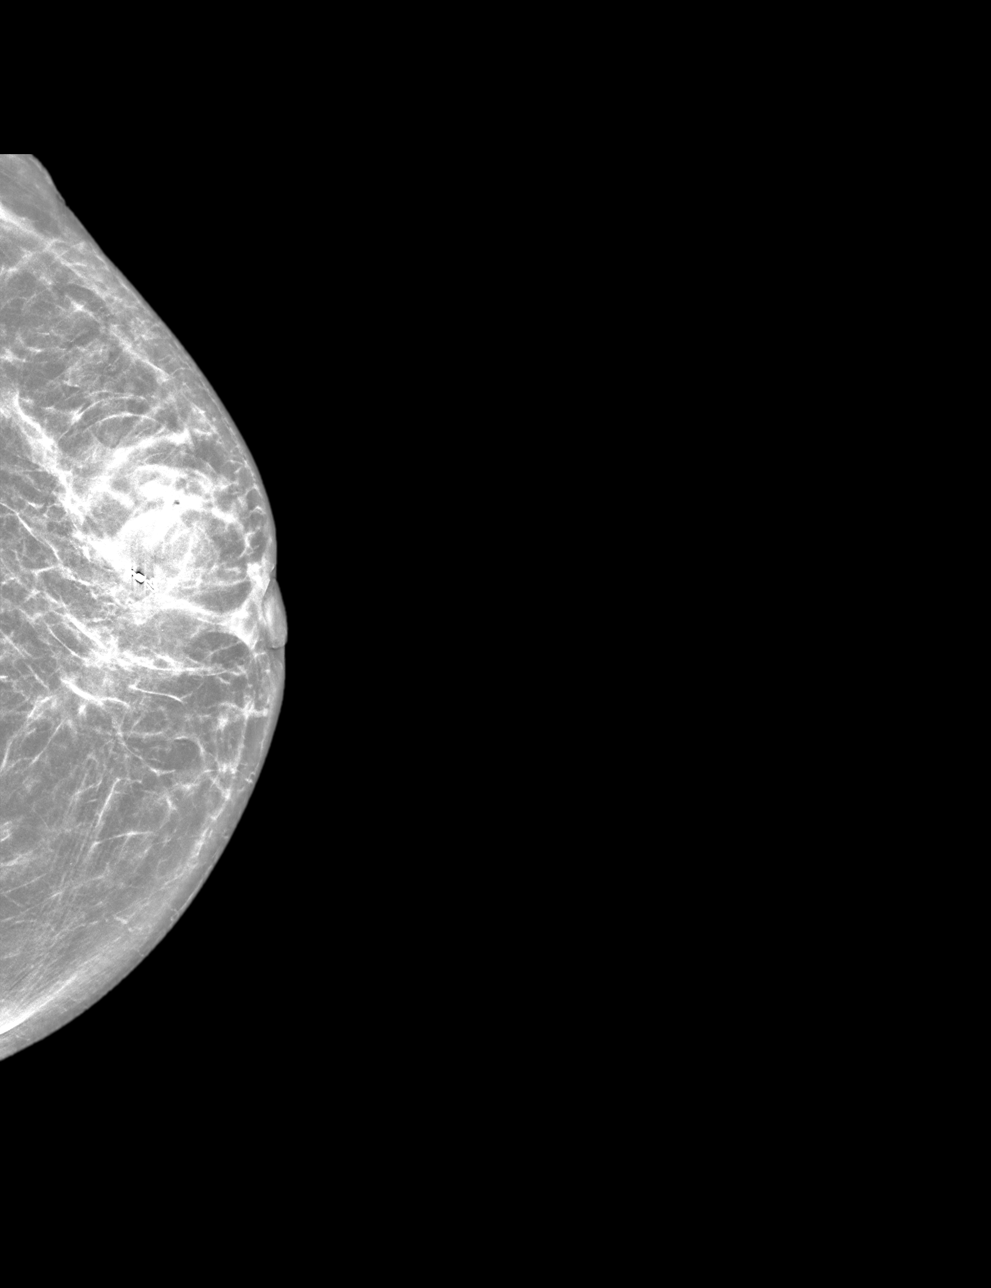

[L ML tomo · tomo slice 31/60.0]
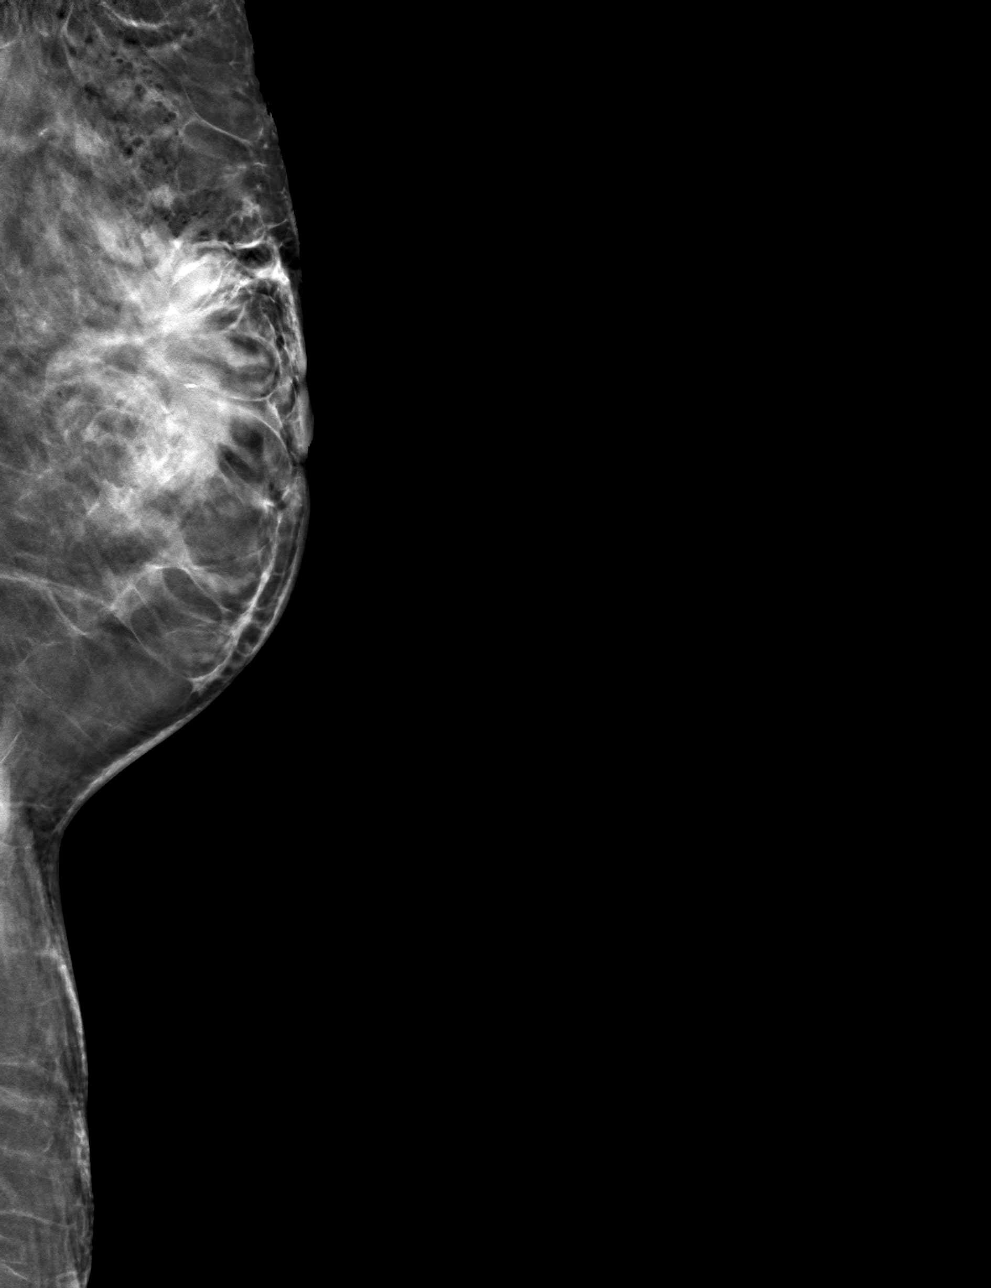

[L CC tomo · tomo slice 29/58.0]
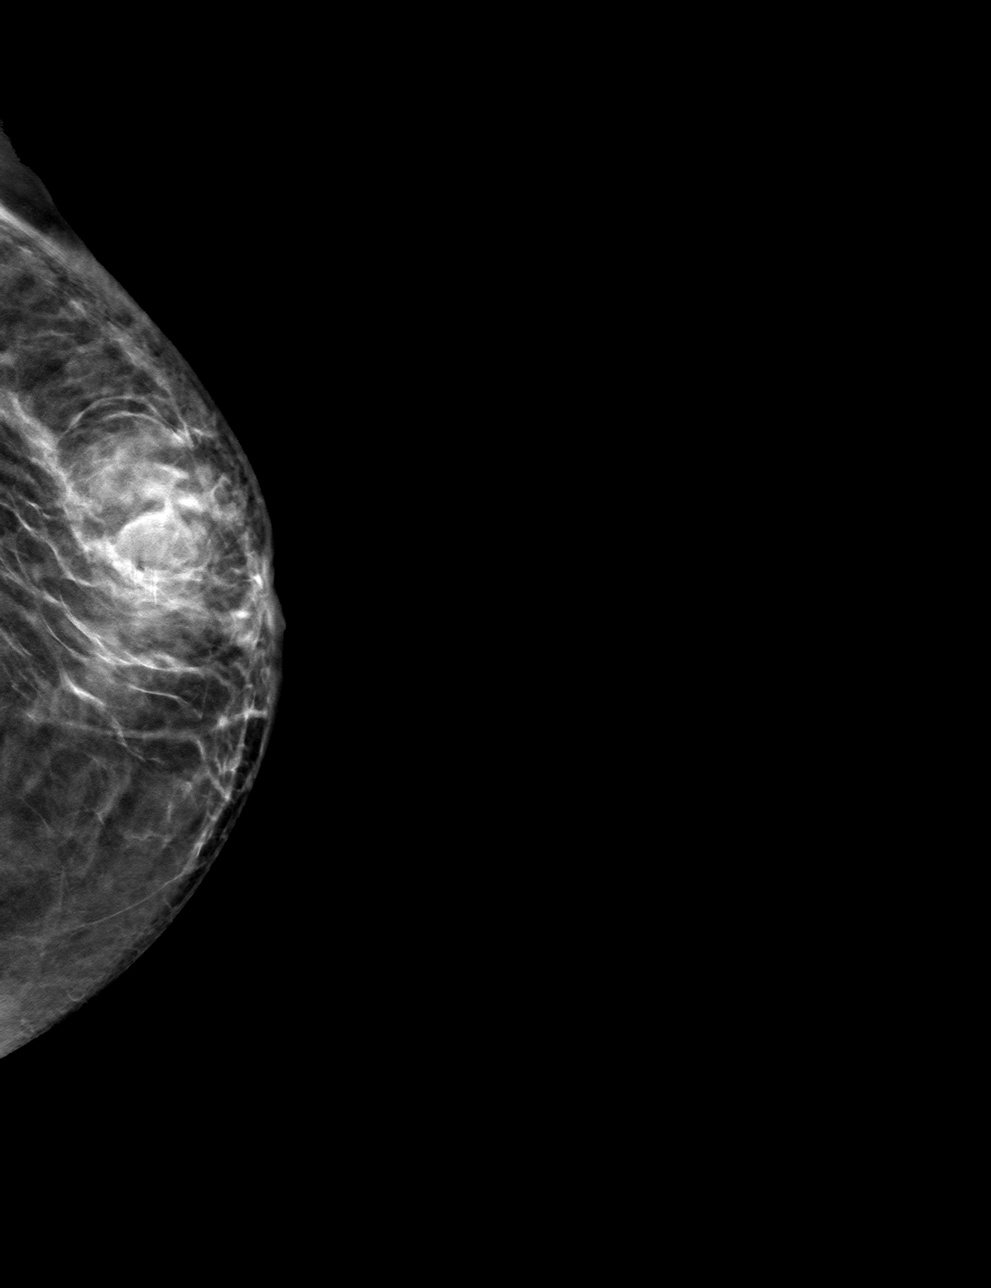

[4 of 12 positions shown; findings below may reference images not displayed]

FINDINGS: Mammographic images were obtained following stereotactic guided
biopsy of calcifications within the slightly upper outer quadrant of
the LEFT breast. The biopsy marking clip is displaced by hematoma
approximately 4 mm medial and 7 mm inferior to today's biopsy site.
IMPRESSION: 1. Coil shaped clip placed for today's stereotactic biopsy is
displaced approximately 4 mm medial and 7 mm inferior to today's
biopsy site.
2. If localization becomes necessary for breast conservation
surgery, recommend localization/excision to include the coil shaped
clip and the overlying/adjacent residual calcifications.
3. Heart shaped clip within the retroareolar LEFT breast corresponds
to ultrasound-guided biopsy site of 10/05/2020 which revealed
malignancy.

Final Assessment: Post Procedure Mammograms for Marker Placement

## 2023-10-21 ENCOUNTER — Other Ambulatory Visit: Payer: Self-pay | Admitting: Obstetrics and Gynecology

## 2023-10-21 DIAGNOSIS — N63 Unspecified lump in unspecified breast: Secondary | ICD-10-CM

## 2023-10-26 ENCOUNTER — Other Ambulatory Visit: Payer: Self-pay | Admitting: Obstetrics and Gynecology

## 2023-10-26 ENCOUNTER — Ambulatory Visit
Admission: RE | Admit: 2023-10-26 | Discharge: 2023-10-26 | Disposition: A | Discharge status: Home / Self Care | Source: Ambulatory Visit | Attending: Obstetrics and Gynecology | Admitting: Obstetrics and Gynecology

## 2023-10-26 DIAGNOSIS — N6320 Unspecified lump in the left breast, unspecified quadrant: Secondary | ICD-10-CM

## 2023-10-26 DIAGNOSIS — N63 Unspecified lump in unspecified breast: Secondary | ICD-10-CM

## 2024-04-28 ENCOUNTER — Ambulatory Visit
Admission: RE | Admit: 2024-04-28 | Discharge: 2024-04-28 | Disposition: A | Discharge status: Home / Self Care | Source: Ambulatory Visit | Attending: Obstetrics and Gynecology | Admitting: Obstetrics and Gynecology

## 2024-04-28 DIAGNOSIS — N63 Unspecified lump in unspecified breast: Secondary | ICD-10-CM

## 2024-04-28 DIAGNOSIS — N6320 Unspecified lump in the left breast, unspecified quadrant: Secondary | ICD-10-CM

## 2024-05-03 ENCOUNTER — Other Ambulatory Visit: Payer: Self-pay | Admitting: Obstetrics and Gynecology

## 2024-05-03 DIAGNOSIS — N6320 Unspecified lump in the left breast, unspecified quadrant: Secondary | ICD-10-CM

## 2024-10-28 ENCOUNTER — Other Ambulatory Visit
